# Patient Record
Sex: Female | Born: 1955 | Race: White | Hispanic: No | Smoking: Never smoker
Health system: Southern US, Community
[De-identification: ages and names within clinical notes are randomized; demographics above are authoritative.]

## PROBLEM LIST (undated history)

## (undated) DIAGNOSIS — M1990 Unspecified osteoarthritis, unspecified site: Secondary | ICD-10-CM

## (undated) DIAGNOSIS — T8859XA Other complications of anesthesia, initial encounter: Secondary | ICD-10-CM

## (undated) DIAGNOSIS — Q613 Polycystic kidney, unspecified: Secondary | ICD-10-CM

## (undated) DIAGNOSIS — I10 Essential (primary) hypertension: Secondary | ICD-10-CM

## (undated) DIAGNOSIS — D649 Anemia, unspecified: Secondary | ICD-10-CM

## (undated) DIAGNOSIS — T4145XA Adverse effect of unspecified anesthetic, initial encounter: Secondary | ICD-10-CM

## (undated) DIAGNOSIS — M810 Age-related osteoporosis without current pathological fracture: Secondary | ICD-10-CM

## (undated) DIAGNOSIS — N289 Disorder of kidney and ureter, unspecified: Secondary | ICD-10-CM

## (undated) HISTORY — PX: OTHER SURGICAL HISTORY: SHX169

## (undated) HISTORY — PX: ABDOMINAL HYSTERECTOMY: SHX81

## (undated) HISTORY — PX: CHOLECYSTECTOMY: SHX55

## (undated) HISTORY — PX: HERNIA REPAIR: SHX51

## (undated) HISTORY — PX: EYE SURGERY: SHX253

---

## 2005-10-05 ENCOUNTER — Encounter: Admission: RE | Admit: 2005-10-05 | Discharge: 2005-10-05 | Payer: Self-pay | Admitting: Surgery

## 2005-11-04 ENCOUNTER — Encounter: Admission: RE | Admit: 2005-11-04 | Discharge: 2005-11-04 | Payer: Self-pay | Admitting: Surgery

## 2005-12-02 ENCOUNTER — Ambulatory Visit (HOSPITAL_COMMUNITY): Admission: RE | Admit: 2005-12-02 | Discharge: 2005-12-03 | Payer: Self-pay | Admitting: Surgery

## 2005-12-05 ENCOUNTER — Inpatient Hospital Stay (HOSPITAL_COMMUNITY): Admission: EM | Admit: 2005-12-05 | Discharge: 2005-12-10 | Payer: Self-pay | Admitting: Emergency Medicine

## 2006-01-12 ENCOUNTER — Inpatient Hospital Stay (HOSPITAL_COMMUNITY): Admission: EM | Admit: 2006-01-12 | Discharge: 2006-01-16 | Payer: Self-pay | Admitting: Emergency Medicine

## 2006-01-19 ENCOUNTER — Ambulatory Visit: Payer: Self-pay | Admitting: Internal Medicine

## 2006-03-08 ENCOUNTER — Ambulatory Visit: Payer: Self-pay | Admitting: Family Medicine

## 2008-04-25 ENCOUNTER — Ambulatory Visit: Payer: Self-pay | Admitting: Family Medicine

## 2008-05-28 IMAGING — CT CT ABDOMEN W/ CM
1 of 6 series · 10 of 32 positions shown, 16 images · IV contrast (omnipaque)
Comparison: 10/05/2005

ABDOMEN CT WITH CONTRAST

CLINICAL DATA: Status post laparoscopic cholecystectomy 42. Abdominal pain.
TECHNIQUE: Multidetector CT imaging of the abdomen and pelvis was performed
following the standard protocol during bolus administration of intravenous
contrast.

Contrast:  125 cc Omnipaque 300

[Series 3: abd_pel 5.0 b40f st · axial · 0.55mm/px · z∈[-319,+36]mm · 10 of 87 slices shown, 16 images]
[im 8/87  soft-tissue]
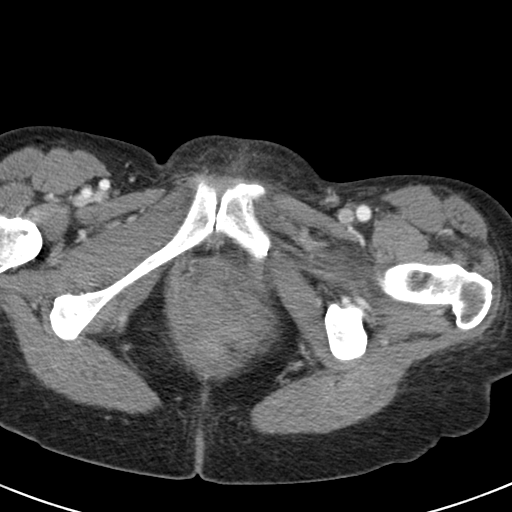
[im 8/87  bone]
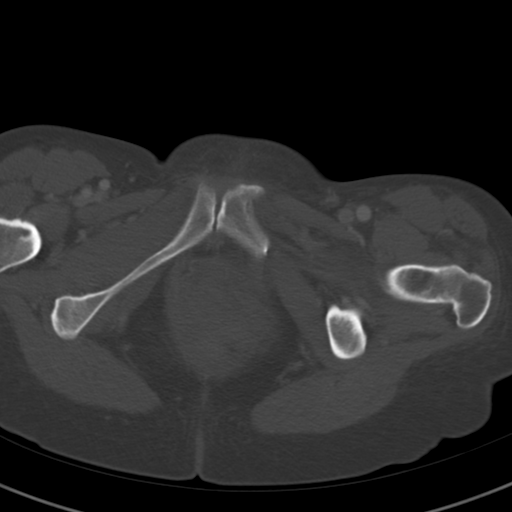
[im 16/87  soft-tissue]
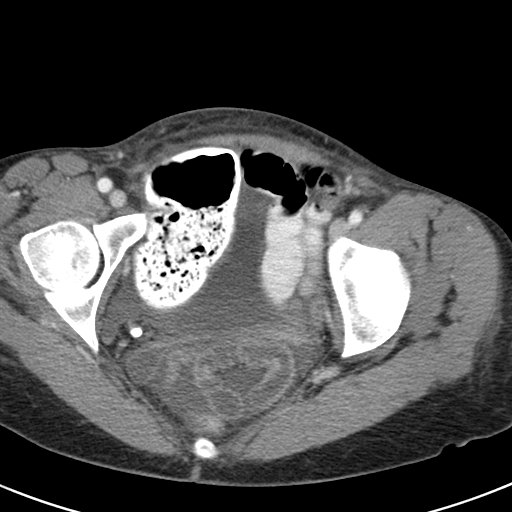
[im 24/87  soft-tissue]
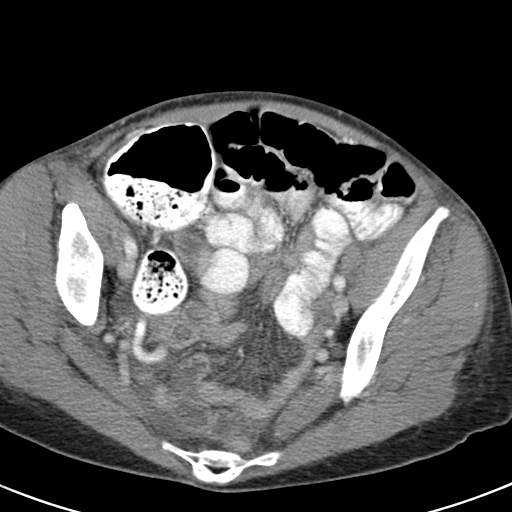
[im 32/87  soft-tissue]
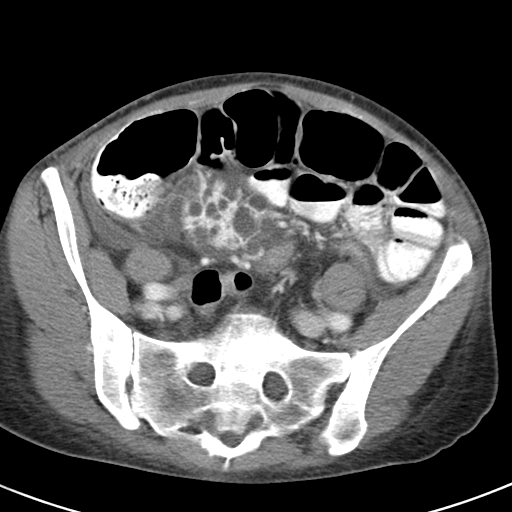
[im 40/87  soft-tissue]
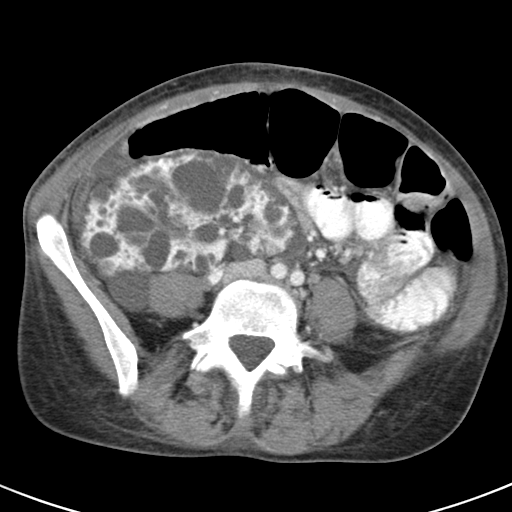
[im 47/87  soft-tissue]
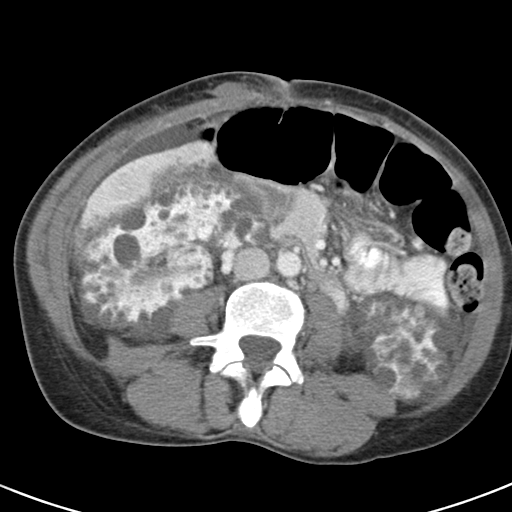
[im 55/87  soft-tissue]
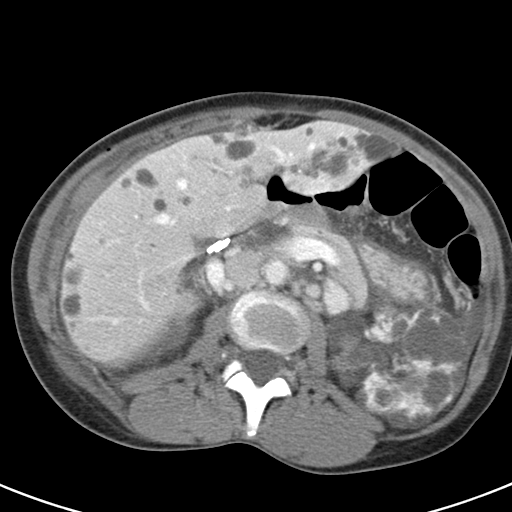
[im 55/87  lung]
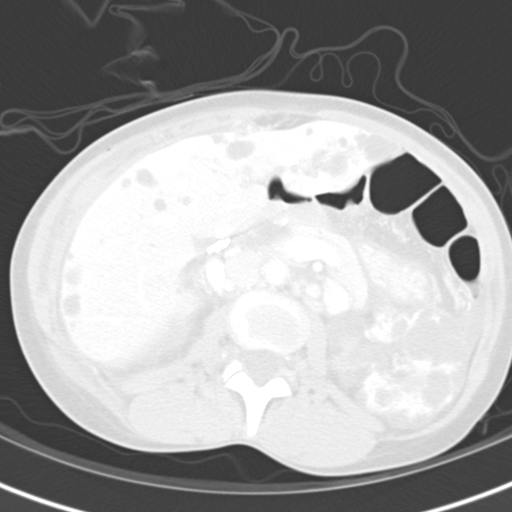
[im 63/87  soft-tissue]
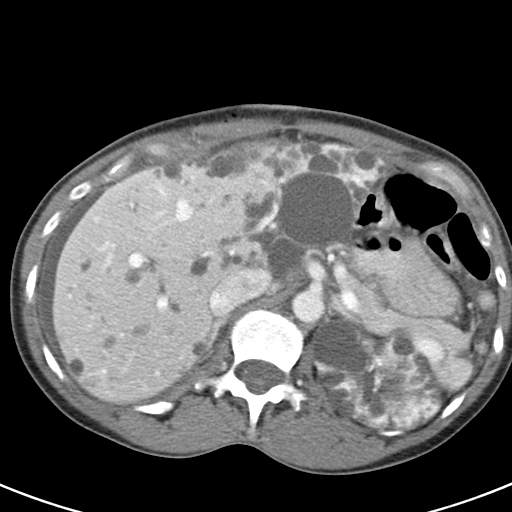
[im 63/87  lung]
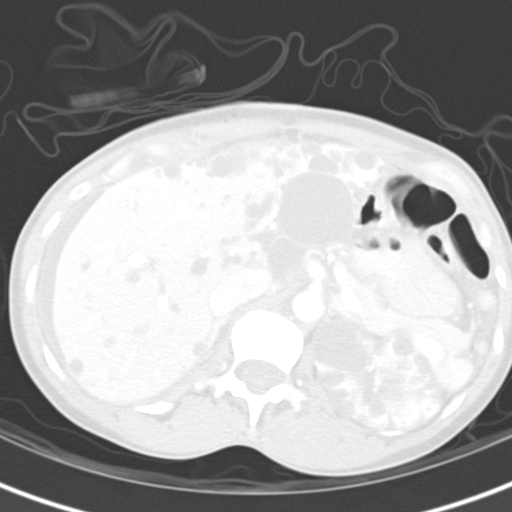
[im 71/87  soft-tissue]
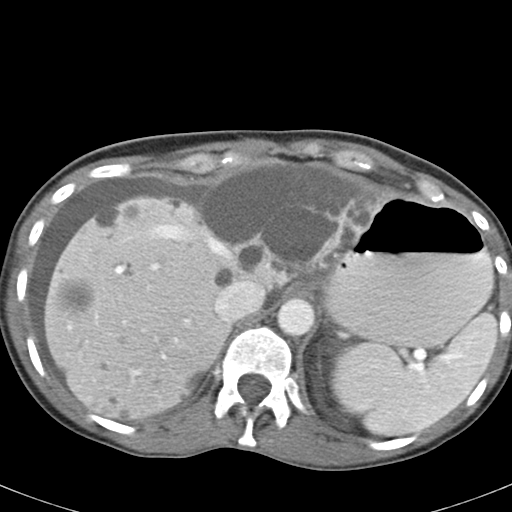
[im 71/87  lung]
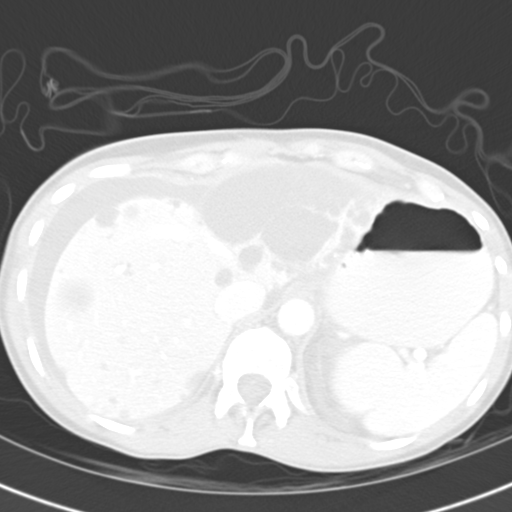
[im 71/87  bone]
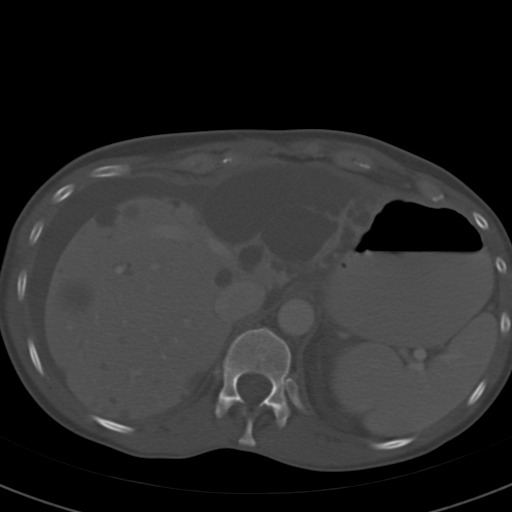
[im 79/87  soft-tissue]
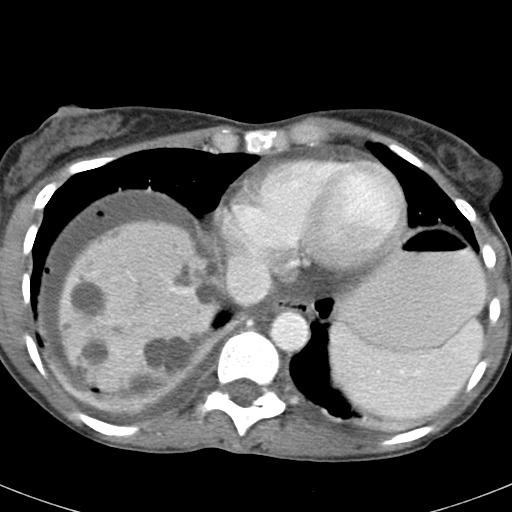
[im 79/87  lung]
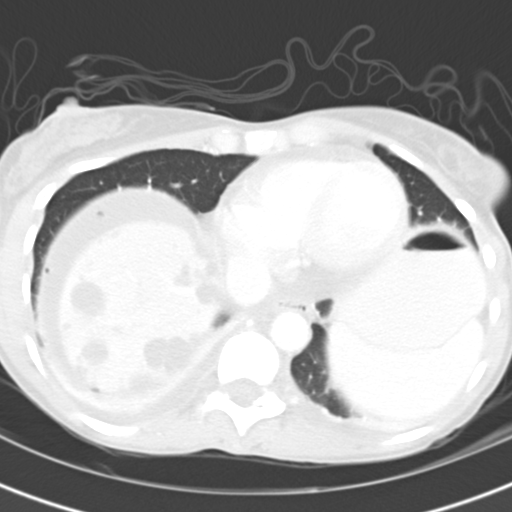

[10 of 32 positions shown; findings below may reference images not displayed]

FINDINGS: Again seen are changes of adult polycystic kidney disease with
extensive bilateral renal and hepatic cysts. Patient is status post
cholecystectomy. There is a moderate amount of ascites noted around the liver
and in the right paracolic gutter. Given recent cholecystectomy, I cannot
exclude bile leak. There are small bilateral pleural effusions and bibasilar
atelectasis. Small amount of gas is noted in the peritoneum, likely related to
recent postoperative state. There is slight prominence of the central
intrahepatic ducts. The common bile duct is mildly dilated to 10 mm. This most
likely represents post cholecystectomy changes. Spleen, adrenals, pancreas
unremarkable.

There is mild gaseous distention of the colon and small bowel loops which may
represent mild ileus.

IMPRESSION

Status post cholecystectomy. There is small to moderate amount of ascites around
the liver and in the right paracolic gutter. I cannot exclude bile leak. Locules
of gas also noted around the liver, likely related to recent surgery.

Mild ileus.

Autosomal dominant polycystic kidney disease.

Small bilateral effusions.

PELVIS CT WITH CONTRAST
FINDINGS: Small to moderate amount of ascites in the pelvis. Bowel remains
mildly dilated. Appendix is normal. Patient is status post hysterectomy. No
adnexal masses.

IMPRESSION

Small to moderate amount of ascites.

Ileus.

## 2008-05-28 IMAGING — NM NM LIVER FUNCTION STUDY
2 series · 7 of 7 positions shown · non-contrast
Comparison: CT 12/06/2005

CLINICAL DATA: Status post cholecystectomy, fever, question bile leak

NM HEPATOBILIARY SCAN
TECHNIQUE: Sequential abdominal images were obtained following intravenous
injection of radiopharmaceutical.
Radiopharmaceutical:  5 mCi Nc-HHm choletec

[Series 1: he hepato · 4.23mm/px · 6 of 45 frames shown]
[frame 4/45]
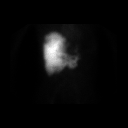
[frame 12/45]
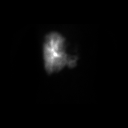
[frame 19/45]
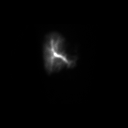
[frame 27/45]
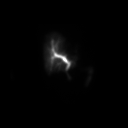
[frame 34/45]
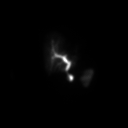
[frame 42/45]
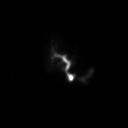

[Series 1: st static · 1 of 1 slices shown]
[im 1/1]
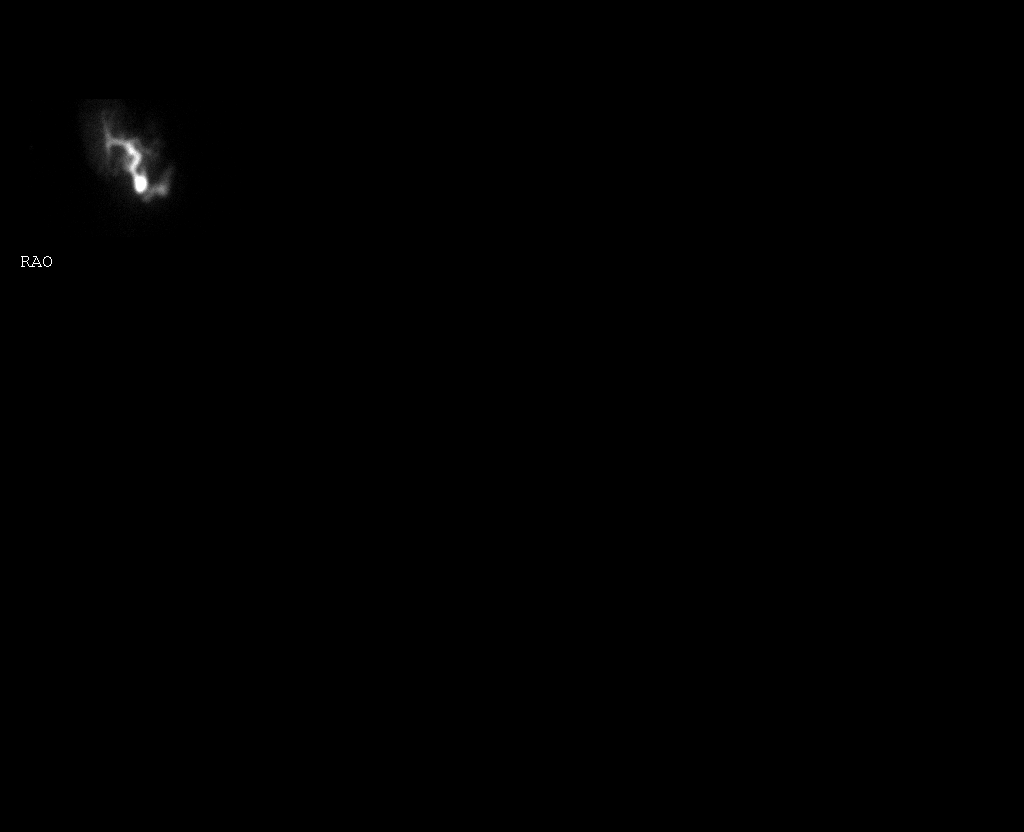

[7 of 7 positions shown; findings below may reference images not displayed]

FINDINGS: There is prompt uptake and excretion of radiotracer by the liver.
Activity is seen within the small bowel at 20 minutes.

Radiotracer is noted passing along the inferior edge of the liver highly
suspicious for a bile leak.

IMPRESSION

Linear radiotracer activity along the inferior surface of the liver highly
suspicious for bile leak.

No evidence of biliary obstruction.

## 2010-06-16 ENCOUNTER — Emergency Department (HOSPITAL_COMMUNITY)
Admission: EM | Admit: 2010-06-16 | Discharge: 2010-06-16 | Disposition: A | Discharge status: Home / Self Care | Payer: BC Managed Care – PPO | Attending: Emergency Medicine | Admitting: Emergency Medicine

## 2010-06-16 ENCOUNTER — Emergency Department (HOSPITAL_COMMUNITY): Payer: BC Managed Care – PPO

## 2010-06-16 DIAGNOSIS — Q613 Polycystic kidney, unspecified: Secondary | ICD-10-CM | POA: Insufficient documentation

## 2010-06-16 DIAGNOSIS — R071 Chest pain on breathing: Secondary | ICD-10-CM | POA: Insufficient documentation

## 2010-06-16 DIAGNOSIS — M549 Dorsalgia, unspecified: Secondary | ICD-10-CM | POA: Insufficient documentation

## 2010-06-16 DIAGNOSIS — Q446 Cystic disease of liver: Secondary | ICD-10-CM | POA: Insufficient documentation

## 2010-06-16 DIAGNOSIS — R079 Chest pain, unspecified: Secondary | ICD-10-CM | POA: Insufficient documentation

## 2010-06-16 LAB — CBC
HCT: 32.8 % — ABNORMAL LOW (ref 36.0–46.0)
Hemoglobin: 10.7 g/dL — ABNORMAL LOW (ref 12.0–15.0)
MCH: 28.3 pg (ref 26.0–34.0)
MCHC: 32.6 g/dL (ref 30.0–36.0)
MCV: 86.8 fL (ref 78.0–100.0)
Platelets: 102 10*3/uL — ABNORMAL LOW (ref 150–400)
RBC: 3.78 MIL/uL — ABNORMAL LOW (ref 3.87–5.11)
RDW: 12.5 % (ref 11.5–15.5)
WBC: 5.3 10*3/uL (ref 4.0–10.5)

## 2010-06-16 LAB — POTASSIUM: Potassium: 4.1 mEq/L (ref 3.5–5.1)

## 2010-06-16 LAB — DIFFERENTIAL
Basophils Absolute: 0 10*3/uL (ref 0.0–0.1)
Basophils Relative: 0 % (ref 0–1)
Eosinophils Absolute: 0.2 10*3/uL (ref 0.0–0.7)
Eosinophils Relative: 3 % (ref 0–5)
Lymphocytes Relative: 19 % (ref 12–46)
Lymphs Abs: 1 10*3/uL (ref 0.7–4.0)
Monocytes Absolute: 0.5 10*3/uL (ref 0.1–1.0)
Monocytes Relative: 9 % (ref 3–12)
Neutro Abs: 3.6 10*3/uL (ref 1.7–7.7)
Neutrophils Relative %: 68 % (ref 43–77)

## 2010-06-16 LAB — URINALYSIS, ROUTINE W REFLEX MICROSCOPIC
Bilirubin Urine: NEGATIVE
Glucose, UA: NEGATIVE mg/dL
Hgb urine dipstick: NEGATIVE
Ketones, ur: NEGATIVE mg/dL
Nitrite: NEGATIVE
Protein, ur: NEGATIVE mg/dL
Specific Gravity, Urine: 1.01 (ref 1.005–1.030)
Urobilinogen, UA: 0.2 mg/dL (ref 0.0–1.0)
pH: 6 (ref 5.0–8.0)

## 2010-06-16 LAB — BASIC METABOLIC PANEL
BUN: 27 mg/dL — ABNORMAL HIGH (ref 6–23)
CO2: 28 mEq/L (ref 19–32)
Calcium: 9.4 mg/dL (ref 8.4–10.5)
Chloride: 106 mEq/L (ref 96–112)
Creatinine, Ser: 1.13 mg/dL (ref 0.4–1.2)
GFR calc Af Amer: >60 mL/min (ref >60)
GFR calc non Af Amer: 50 mL/min — ABNORMAL LOW (ref >60)
Glucose, Bld: 108 mg/dL — ABNORMAL HIGH (ref 70–99)
Potassium: 6.2 mEq/L — ABNORMAL HIGH (ref 3.5–5.1)
Sodium: 139 mEq/L (ref 135–145)

## 2010-06-16 MED ORDER — IOHEXOL 300 MG/ML  SOLN
100.0000 mL | Freq: Once | INTRAMUSCULAR | Status: AC | PRN
  Administered 2010-06-16: 100 mL via INTRAVENOUS

## 2010-06-17 LAB — POCT CARDIAC MARKERS
CKMB, poc: 1.3 ng/mL (ref 1.0–8.0)
Myoglobin, poc: 187 ng/mL (ref 12–200)
Troponin i, poc: <0.05 ng/mL (ref 0.00–0.09)

## 2010-06-17 LAB — URINE CULTURE
Colony Count: NO GROWTH
Culture  Setup Time: 201203140111
Culture: NO GROWTH

## 2012-04-11 ENCOUNTER — Ambulatory Visit: Payer: Self-pay | Admitting: Family Medicine

## 2012-11-09 ENCOUNTER — Ambulatory Visit: Payer: Self-pay | Admitting: Unknown Physician Specialty

## 2014-10-03 ENCOUNTER — Inpatient Hospital Stay: Admission: RE | Admit: 2014-10-03 | Payer: Self-pay | Source: Ambulatory Visit

## 2014-10-11 DIAGNOSIS — Q613 Polycystic kidney, unspecified: Secondary | ICD-10-CM | POA: Diagnosis not present

## 2014-10-11 DIAGNOSIS — H2511 Age-related nuclear cataract, right eye: Secondary | ICD-10-CM | POA: Diagnosis not present

## 2014-10-11 DIAGNOSIS — M810 Age-related osteoporosis without current pathological fracture: Secondary | ICD-10-CM | POA: Diagnosis not present

## 2014-10-13 NOTE — H&P (Signed)
  History and physical was faxed and scanned in.   

## 2014-10-14 ENCOUNTER — Encounter: Payer: Self-pay | Admitting: *Deleted

## 2014-10-14 ENCOUNTER — Encounter
Admission: RE | Disposition: A | Discharge status: Home / Self Care | Payer: Self-pay | Source: Ambulatory Visit | Attending: Ophthalmology

## 2014-10-14 ENCOUNTER — Ambulatory Visit: Payer: BLUE CROSS/BLUE SHIELD | Admitting: *Deleted

## 2014-10-14 ENCOUNTER — Ambulatory Visit
Admission: RE | Admit: 2014-10-14 | Discharge: 2014-10-14 | Disposition: A | Discharge status: Home / Self Care | Payer: BLUE CROSS/BLUE SHIELD | Source: Ambulatory Visit | Attending: Ophthalmology | Admitting: Ophthalmology

## 2014-10-14 DIAGNOSIS — M810 Age-related osteoporosis without current pathological fracture: Secondary | ICD-10-CM | POA: Insufficient documentation

## 2014-10-14 DIAGNOSIS — H2511 Age-related nuclear cataract, right eye: Secondary | ICD-10-CM | POA: Insufficient documentation

## 2014-10-14 DIAGNOSIS — Q613 Polycystic kidney, unspecified: Secondary | ICD-10-CM | POA: Insufficient documentation

## 2014-10-14 HISTORY — DX: Polycystic kidney, unspecified: Q61.3

## 2014-10-14 HISTORY — PX: CATARACT EXTRACTION W/PHACO: SHX586

## 2014-10-14 HISTORY — DX: Age-related osteoporosis without current pathological fracture: M81.0

## 2014-10-14 SURGERY — PHACOEMULSIFICATION, CATARACT, WITH IOL INSERTION
Anesthesia: Monitor Anesthesia Care | Site: Eye | Laterality: Left | Wound class: Clean

## 2014-10-14 MED ORDER — CEFUROXIME OPHTHALMIC INJECTION 1 MG/0.1 ML
INJECTION | OPHTHALMIC | Status: DC | PRN
  Administered 2014-10-14: .5 mL via INTRACAMERAL

## 2014-10-14 MED ORDER — CARBACHOL 0.01 % IO SOLN
INTRAOCULAR | Status: DC | PRN
  Administered 2014-10-14: 0.5 mL via INTRAOCULAR

## 2014-10-14 MED ORDER — MOXIFLOXACIN HCL 0.5 % OP SOLN
OPHTHALMIC | Status: AC
  Administered 2014-10-14: 1 [drp] via OPHTHALMIC
  Filled 2014-10-14: qty 3

## 2014-10-14 MED ORDER — HYALURONIDASE HUMAN 150 UNIT/ML IJ SOLN
INTRAMUSCULAR | Status: AC
  Filled 2014-10-14: qty 1

## 2014-10-14 MED ORDER — ALFENTANIL 500 MCG/ML IJ INJ
INJECTION | INTRAMUSCULAR | Status: DC | PRN
  Administered 2014-10-14: 500 ug via INTRAVENOUS

## 2014-10-14 MED ORDER — PHENYLEPHRINE HCL 10 % OP SOLN
1.0000 [drp] | OPHTHALMIC | Status: DC | PRN
  Administered 2014-10-14: 1 [drp] via OPHTHALMIC

## 2014-10-14 MED ORDER — ACETAMINOPHEN 325 MG PO TABS
ORAL_TABLET | ORAL | Status: AC
  Filled 2014-10-14: qty 2

## 2014-10-14 MED ORDER — MIDAZOLAM HCL 2 MG/2ML IJ SOLN
INTRAMUSCULAR | Status: DC | PRN
  Administered 2014-10-14: 1 mg via INTRAVENOUS

## 2014-10-14 MED ORDER — PHENYLEPHRINE HCL 10 % OP SOLN
OPHTHALMIC | Status: AC
  Administered 2014-10-14: 1 [drp] via OPHTHALMIC
  Filled 2014-10-14: qty 5

## 2014-10-14 MED ORDER — MOXIFLOXACIN HCL 0.5 % OP SOLN - NO CHARGE
OPHTHALMIC | Status: DC | PRN
  Administered 2014-10-14: 1 [drp] via OPHTHALMIC

## 2014-10-14 MED ORDER — CYCLOPENTOLATE HCL 2 % OP SOLN
1.0000 [drp] | OPHTHALMIC | Status: DC | PRN
  Administered 2014-10-14: 1 [drp] via OPHTHALMIC

## 2014-10-14 MED ORDER — ACETAMINOPHEN 325 MG PO TABS
650.0000 mg | ORAL_TABLET | Freq: Four times a day (QID) | ORAL | Status: DC | PRN
  Administered 2014-10-14: 650 mg via ORAL

## 2014-10-14 MED ORDER — EPINEPHRINE HCL 1 MG/ML IJ SOLN
INTRAMUSCULAR | Status: AC
  Filled 2014-10-14: qty 2

## 2014-10-14 MED ORDER — EPINEPHRINE HCL 1 MG/ML IJ SOLN
INTRAOCULAR | Status: DC | PRN
  Administered 2014-10-14: 200 mL

## 2014-10-14 MED ORDER — BUPIVACAINE HCL (PF) 0.75 % IJ SOLN
INTRAMUSCULAR | Status: AC
  Filled 2014-10-14: qty 10

## 2014-10-14 MED ORDER — NA CHONDROIT SULF-NA HYALURON 40-17 MG/ML IO SOLN
INTRAOCULAR | Status: AC
  Filled 2014-10-14: qty 1

## 2014-10-14 MED ORDER — TETRACAINE HCL 0.5 % OP SOLN
OPHTHALMIC | Status: DC | PRN
  Administered 2014-10-14: 1 [drp] via OPHTHALMIC

## 2014-10-14 MED ORDER — TETRACAINE HCL 0.5 % OP SOLN
OPHTHALMIC | Status: AC
  Filled 2014-10-14: qty 2

## 2014-10-14 MED ORDER — LIDOCAINE HCL (PF) 4 % IJ SOLN
INTRAMUSCULAR | Status: AC
  Filled 2014-10-14: qty 10

## 2014-10-14 MED ORDER — SODIUM CHLORIDE 0.9 % IV SOLN
INTRAVENOUS | Status: DC
  Administered 2014-10-14: 11:00:00 via INTRAVENOUS

## 2014-10-14 MED ORDER — MOXIFLOXACIN HCL 0.5 % OP SOLN
1.0000 [drp] | OPHTHALMIC | Status: DC | PRN
  Administered 2014-10-14: 1 [drp] via OPHTHALMIC

## 2014-10-14 MED ORDER — NA CHONDROIT SULF-NA HYALURON 40-17 MG/ML IO SOLN
INTRAOCULAR | Status: DC | PRN
  Administered 2014-10-14: 1 mL via INTRAOCULAR

## 2014-10-14 MED ORDER — CYCLOPENTOLATE HCL 2 % OP SOLN
OPHTHALMIC | Status: AC
  Administered 2014-10-14: 1 [drp] via OPHTHALMIC
  Filled 2014-10-14: qty 2

## 2014-10-14 MED ORDER — CEFUROXIME OPHTHALMIC INJECTION 1 MG/0.1 ML
INJECTION | OPHTHALMIC | Status: AC
  Filled 2014-10-14: qty 0.1

## 2014-10-14 SURGICAL SUPPLY — 25 items
CORD BIP STRL DISP 12FT (MISCELLANEOUS) ×2 IMPLANT
DRAPE XRAY CASSETTE 23X24 (DRAPES) ×2 IMPLANT
ERASER HMR WETFIELD 18G (MISCELLANEOUS) ×2 IMPLANT
GLOVE BIO SURGEON STRL SZ8 (GLOVE) ×2 IMPLANT
GLOVE SURG LX 6.5 MICRO (GLOVE) ×1
GLOVE SURG LX 8.0 MICRO (GLOVE) ×1
GLOVE SURG LX STRL 6.5 MICRO (GLOVE) ×1 IMPLANT
GLOVE SURG LX STRL 8.0 MICRO (GLOVE) ×1 IMPLANT
GOWN STRL REUS W/ TWL LRG LVL3 (GOWN DISPOSABLE) ×1 IMPLANT
GOWN STRL REUS W/ TWL XL LVL3 (GOWN DISPOSABLE) ×1 IMPLANT
GOWN STRL REUS W/TWL LRG LVL3 (GOWN DISPOSABLE) ×2
GOWN STRL REUS W/TWL XL LVL3 (GOWN DISPOSABLE) ×2
LENS IOL ACRSF IQ ULTRA 14.0 (Intraocular Lens) IMPLANT
LENS IOL ACRYSOF IQ 14.0 (Intraocular Lens) ×2 IMPLANT
PACK CATARACT (MISCELLANEOUS) ×2 IMPLANT
PACK CATARACT DINGLEDEIN LX (MISCELLANEOUS) ×2 IMPLANT
PACK EYE AFTER SURG (MISCELLANEOUS) ×2 IMPLANT
SHLD EYE VISITEC  UNIV (MISCELLANEOUS) ×2 IMPLANT
SOL PREP PVP 2OZ (MISCELLANEOUS) ×2
SOLUTION PREP PVP 2OZ (MISCELLANEOUS) ×1 IMPLANT
SUT SILK 5-0 (SUTURE) ×2 IMPLANT
SYR 5ML LL (SYRINGE) ×2 IMPLANT
SYR TB 1ML 27GX1/2 LL (SYRINGE) ×2 IMPLANT
WATER STERILE IRR 1000ML POUR (IV SOLUTION) ×2 IMPLANT
WIPE NON LINTING 3.25X3.25 (MISCELLANEOUS) ×2 IMPLANT

## 2014-10-14 NOTE — Op Note (Signed)
Date of Surgery: 10/14/2014 Date of Dictation: 10/14/2014 12:28 PM Pre-operative Diagnosis:  Nuclear Sclerotic Cataract right Eye Post-operative Diagnosis: same Procedure performed: Extra-capsular Cataract Extraction (ECCE) with placement of a posterior chamber intraocular lens (IOL) right Eye IOL:  Implant Name Type Inv. Item Serial No. Manufacturer Lot No. LRB No. Used  [id number]     [id number]     Left 1   Anesthesia: 2% Lidocaine and 4% Marcaine in a 50/50 mixture with 10 unites/ml of Hylenex given as a peribulbar Anesthesiologist: Anesthesiologist: Linward NatalAmy Rice, MD CRNA: Darrol Jumpavid Marion, CRNA Complications: none Estimated Blood Loss: less than 1 ml  Description of procedure:  The patient was given anesthesia and sedation via intravenous access. The patient was then prepped and draped in the usual fashion. A 25-gauge needle was bent for initiating the capsulorhexis. A 5-0 silk suture was placed through the conjunctiva superior and inferiorly to serve as bridle sutures. Hemostasis was obtained at the superior limbus using an eraser cautery. A partial thickness groove was made at the anterior surgical limbus with a 64 Beaver blade and this was dissected anteriorly with an SYSCOlcon Crescent knife. The anterior chamber was entered at 10 o'clock with a 1.0 mm paracentesis knife and through the lamellar dissection with a 2.6 mm Alcon keratome. DiscoVisc was injected to replace the aqueous and a continuous tear curvilinear capsulorhexis was performed using a bent 25-gauge needle.  Balance salt on a syringe was used to perform hydro-dissection and phacoemulsification was carried out using a divide and conquer technique. Procedure(s) with comments: CATARACT EXTRACTION PHACO AND INTRAOCULAR LENS PLACEMENT (IOC) (Left) - US 01:09 AP% 60.0 CDE 37.21 fluid pack lot #[id number]. Irrigation/aspiration was used to remove the residual cortex and the capsular bag was inflated with DiscoVisc. The intraocular lens was  inserted into the capsular bag using a pre-loaded UltraSert Delivery System. Irrigation/aspiration was used to remove the residual DiscoVisc. The wound was inflated with balanced salt and checked for leaks. None were found. Miostat was injected via the paracentesis track and 0.1 ml of cefuroxime containing 1 mg of drug  was injected via the paracentesis track. The wound was checked for leaks again and none were found.   The bridal sutures were removed and two drops of Vigamox were placed on the eye. An eye shield was placed to protect the eye and the patient was discharged to the recovery area in good condition.   Vanellope Passmore MD

## 2014-10-14 NOTE — Anesthesia Postprocedure Evaluation (Signed)
  Anesthesia Post-op Note  Patient: Jamie Foley  Procedure(s) Performed: Procedure(s) with comments: CATARACT EXTRACTION PHACO AND INTRAOCULAR LENS PLACEMENT (IOC) (Left) - US 01:09 AP% 60.0 CDE 37.21 fluid pack lot #[id number] H  Anesthesia type:MAC  Patient location: PACU  Post pain: Pain level controlled  Post assessment: Post-op Vital signs reviewed, Patient's Cardiovascular Status Stable, Respiratory Function Stable, Patent Airway and No signs of Nausea or vomiting  Post vital signs: Reviewed and stable  Last Vitals:  Filed Vitals:   10/14/14 1231  BP: 148/82  Pulse:   Temp: 36.6 C  Resp: 16    Level of consciousness: awake, alert  and patient cooperative  Complications: No apparent anesthesia complications

## 2014-10-14 NOTE — Discharge Instructions (Addendum)
See handout Eye Surgery Discharge Instructions  Expect mild scratchy sensation or mild soreness. DO NOT RUB YOUR EYE!  The day of surgery:  Minimal physical activity, but bed rest is not required  No reading, computer work, or close hand work  No bending, lifting, or straining.  May watch TV  For 24 hours:  No driving, legal decisions, or alcoholic beverages  Safety precautions  Eat anything you prefer: It is better to start with liquids, then soup then solid foods.  _____ Eye patch should be worn until postoperative exam tomorrow.  ____ Solar shield eyeglasses should be worn for comfort in the sunlight/patch while sleeping  Resume all regular medications including aspirin or Coumadin if these were discontinued prior to surgery. You may shower, bathe, shave, or wash your hair. Tylenol may be taken for mild discomfort.  Call your doctor if you experience significant pain, nausea, or vomiting, fever > 101 or other signs of infection. [phone] or [phone] Specific instructions:  Follow-up Information    Follow up with Sallee LangeINGELDEIN,STEVEN, MD On 10/15/2014.   Specialty:  Ophthalmology   Why:  9:10   Contact information:   [address]   [place] [id number] [phone]

## 2014-10-14 NOTE — Anesthesia Preprocedure Evaluation (Signed)
Anesthesia Evaluation  Patient identified by MRN, date of birth, ID band Patient awake    Reviewed: Allergy & Precautions, NPO status , Patient's Chart, lab work & pertinent test results  Airway Mallampati: I  TM Distance: >3 FB Neck ROM: Full    Dental  (+) Teeth Intact   Pulmonary    Pulmonary exam normal       Cardiovascular Exercise Tolerance: Good negative cardio ROS Normal cardiovascular exam    Neuro/Psych    GI/Hepatic   Endo/Other    Renal/GU      Musculoskeletal   Abdominal Normal abdominal exam  (+)   Peds  Hematology   Anesthesia Other Findings   Reproductive/Obstetrics                             Anesthesia Physical Anesthesia Plan  ASA: II  Anesthesia Plan: MAC   Post-op Pain Management:    Induction:   Airway Management Planned: Nasal Cannula  Additional Equipment:   Intra-op Plan:   Post-operative Plan:   Informed Consent: I have reviewed the patients History and Physical, chart, labs and discussed the procedure including the risks, benefits and alternatives for the proposed anesthesia with the patient or authorized representative who has indicated his/her understanding and acceptance.     Plan Discussed with: CRNA  Anesthesia Plan Comments:         Anesthesia Quick Evaluation

## 2014-10-14 NOTE — Anesthesia Postprocedure Evaluation (Signed)
  Anesthesia Post-op Note  Patient: Jamie Foley  Procedure(s) Performed: Procedure(s) with comments: CATARACT EXTRACTION PHACO AND INTRAOCULAR LENS PLACEMENT (IOC) (Left) - US 01:09 AP% 60.0 CDE 37.21 fluid pack lot #[id number]  Anesthesia type:MAC  Patient location: PACU  Post pain: Pain level controlled  Post assessment: Post-op Vital signs reviewed, Patient's Cardiovascular Status Stable, Respiratory Function Stable, Patent Airway and No signs of Nausea or vomiting  Post vital signs: Reviewed and stable  Last Vitals:  Filed Vitals:   10/14/14 1231  BP: 148/82  Pulse:   Temp: 36.6 C  Resp: 16    Level of consciousness: awake, alert  and patient cooperative  Complications: No apparent anesthesia complications  

## 2014-10-14 NOTE — Interval H&P Note (Signed)
History and Physical Interval Note:  10/14/2014 11:46 AM  Jamie Foley  has presented today for surgery, with the diagnosis of CATARACT  The various methods of treatment have been discussed with the patient and family. After consideration of risks, benefits and other options for treatment, the patient has consented to  Procedure(s): CATARACT EXTRACTION PHACO AND INTRAOCULAR LENS PLACEMENT (IOC) (Left) as a surgical intervention .  The patient's history has been reviewed, patient examined, no change in status, stable for surgery.  I have reviewed the patient's chart and labs.  Questions were answered to the patient's satisfaction.     Aven Christen

## 2014-10-14 NOTE — Transfer of Care (Signed)
Immediate Anesthesia Transfer of Care Note  Patient: Jamie RamusRebecca D Mazon  Procedure(s) Performed: Procedure(s) with comments: CATARACT EXTRACTION PHACO AND INTRAOCULAR LENS PLACEMENT (IOC) (Left) - US 01:09 AP% 60.0 CDE 37.21 fluid pack lot #[id number] H  Patient Location: PACU  Anesthesia Type:MAC  Level of Consciousness: awake, alert , oriented and patient cooperative  Airway & Oxygen Therapy: Patient Spontanous Breathing  Post-op Assessment: Report given to RN and Post -op Vital signs reviewed and stable  Post vital signs: Reviewed and stable  Last Vitals:  Filed Vitals:   10/14/14 1231  BP: 148/82  Pulse:   Temp: 36.6 C  Resp: 16    Complications: No apparent anesthesia complications

## 2015-05-28 ENCOUNTER — Encounter: Payer: Self-pay | Admitting: Ophthalmology

## 2015-10-05 ENCOUNTER — Emergency Department
Admission: EM | Admit: 2015-10-05 | Discharge: 2015-10-05 | Disposition: A | Discharge status: Home / Self Care | Payer: BLUE CROSS/BLUE SHIELD | Attending: Emergency Medicine | Admitting: Emergency Medicine

## 2015-10-05 ENCOUNTER — Emergency Department: Payer: BLUE CROSS/BLUE SHIELD

## 2015-10-05 DIAGNOSIS — M810 Age-related osteoporosis without current pathological fracture: Secondary | ICD-10-CM | POA: Insufficient documentation

## 2015-10-05 DIAGNOSIS — Y999 Unspecified external cause status: Secondary | ICD-10-CM | POA: Diagnosis not present

## 2015-10-05 DIAGNOSIS — S61211A Laceration without foreign body of left index finger without damage to nail, initial encounter: Secondary | ICD-10-CM | POA: Diagnosis not present

## 2015-10-05 DIAGNOSIS — S6992XA Unspecified injury of left wrist, hand and finger(s), initial encounter: Secondary | ICD-10-CM | POA: Diagnosis present

## 2015-10-05 DIAGNOSIS — Y939 Activity, unspecified: Secondary | ICD-10-CM | POA: Diagnosis not present

## 2015-10-05 DIAGNOSIS — W231XXA Caught, crushed, jammed, or pinched between stationary objects, initial encounter: Secondary | ICD-10-CM | POA: Insufficient documentation

## 2015-10-05 DIAGNOSIS — Y92812 Truck as the place of occurrence of the external cause: Secondary | ICD-10-CM | POA: Insufficient documentation

## 2015-10-05 DIAGNOSIS — S67191A Crushing injury of left index finger, initial encounter: Secondary | ICD-10-CM

## 2015-10-05 MED ORDER — TETANUS-DIPHTH-ACELL PERTUSSIS 5-2.5-18.5 LF-MCG/0.5 IM SUSP
0.5000 mL | Freq: Once | INTRAMUSCULAR | Status: AC
  Administered 2015-10-05: 0.5 mL via INTRAMUSCULAR
  Filled 2015-10-05: qty 0.5

## 2015-10-05 MED ORDER — BACITRACIN ZINC 500 UNIT/GM EX OINT
TOPICAL_OINTMENT | CUTANEOUS | Status: AC
  Administered 2015-10-05: 1 via TOPICAL
  Filled 2015-10-05: qty 0.9

## 2015-10-05 MED ORDER — BACITRACIN ZINC 500 UNIT/GM EX OINT
1.0000 "application " | TOPICAL_OINTMENT | Freq: Two times a day (BID) | CUTANEOUS | Status: DC
  Administered 2015-10-05: 1 via TOPICAL
  Filled 2015-10-05: qty 0.9

## 2015-10-05 NOTE — Discharge Instructions (Signed)
Crush Injury, Fingers or Toes A crush injury to the fingers or toes means the tissues have been damaged by being squeezed (compressed). There will be bleeding into the tissues and swelling. Often, blood will collect under the skin. When this happens, the skin on the finger often dies and may slough off (shed) 1 week to 10 days later. Usually, new skin is growing underneath. If the injury has been too severe and the tissue does not survive, the damaged tissue may begin to turn black over several days.  Wounds which occur because of the crushing may be stitched (sutured) shut. However, crush injuries are more likely to become infected than other injuries.These wounds may not be closed as tightly as other types of cuts to prevent infection. Nails involved are often lost. These usually grow back over several weeks.  DIAGNOSIS X-rays may be taken to see if there is any injury to the bones. TREATMENT Broken bones (fractures) may be treated with splinting, depending on the fracture. Often, no treatment is required for fractures of the last bone in the fingers or toes. HOME CARE INSTRUCTIONS   The crushed part should be raised (elevated) above the heart or center of the chest as much as possible for the first several days or as directed. This helps with pain and lessens swelling. Less swelling increases the chances that the crushed part will survive.  Put ice on the injured area.  Put ice in a plastic bag.  Place a towel between your skin and the bag.  Leave the ice on for 15-20 minutes, 03-04 times a day for the first 2 days.  Only take over-the-counter or prescription medicines for pain, discomfort, or fever as directed by your caregiver.  Use your injured part only as directed.  Change your bandages (dressings) as directed.  Keep all follow-up appointments as directed by your caregiver. Not keeping your appointment could result in a chronic or permanent injury, pain, and disability. If there is  any problem keeping the appointment, you must call to reschedule. SEEK IMMEDIATE MEDICAL CARE IF:   There is redness, swelling, or increasing pain in the wound area.  Pus is coming from the wound.  You have a fever.  You notice a bad smell coming from the wound or dressing.  The edges of the wound do not stay together after the sutures have been removed.  You are unable to move the injured finger or toe. MAKE SURE YOU:   Understand these instructions.  Will watch your condition.  Will get help right away if you are not doing well or get worse.   This information is not intended to replace advice given to you by your health care provider. Make sure you discuss any questions you have with your health care provider.   Document Released: 03/22/2005 Document Revised: 06/14/2011 Document Reviewed: 08/07/2010 Elsevier Interactive Patient Education 2016 [employer]  Laceration Care, Adult A laceration is a cut that goes through all of the layers of the skin and into the tissue that is right under the skin. Some lacerations heal on their own. Others need to be closed with stitches (sutures), staples, skin adhesive strips, or skin glue. Proper laceration care minimizes the risk of infection and helps the laceration to heal better. HOW TO CARE FOR YOUR LACERATION If sutures or staples were used:  Keep the wound clean and dry.  If you were given a bandage (dressing), you should change it at least one time per day or as told  by your health care provider. You should also change it if it becomes wet or dirty.  Keep the wound completely dry for the first 24 hours or as told by your health care provider. After that time, you may shower or bathe. However, make sure that the wound is not soaked in water until after the sutures or staples have been removed.  Clean the wound one time each day or as told by your health care provider:  Wash the wound with soap and water.  Rinse the wound with  water to remove all soap.  Pat the wound dry with a clean towel. Do not rub the wound.  After cleaning the wound, apply a thin layer of antibiotic ointmentas told by your health care provider. This will help to prevent infection and keep the dressing from sticking to the wound.  Have the sutures or staples removed as told by your health care provider. If skin adhesive strips were used:  Keep the wound clean and dry.  If you were given a bandage (dressing), you should change it at least one time per day or as told by your health care provider. You should also change it if it becomes dirty or wet.  Do not get the skin adhesive strips wet. You may shower or bathe, but be careful to keep the wound dry.  If the wound gets wet, pat it dry with a clean towel. Do not rub the wound.  Skin adhesive strips fall off on their own. You may trim the strips as the wound heals. Do not remove skin adhesive strips that are still stuck to the wound. They will fall off in time. If skin glue was used:  Try to keep the wound dry, but you may briefly wet it in the shower or bath. Do not soak the wound in water, such as by swimming.  After you have showered or bathed, gently pat the wound dry with a clean towel. Do not rub the wound.  Do not do any activities that will make you sweat heavily until the skin glue has fallen off on its own.  Do not apply liquid, cream, or ointment medicine to the wound while the skin glue is in place. Using those may loosen the film before the wound has healed.  If you were given a bandage (dressing), you should change it at least one time per day or as told by your health care provider. You should also change it if it becomes dirty or wet.  If a dressing is placed over the wound, be careful not to apply tape directly over the skin glue. Doing that may cause the glue to be pulled off before the wound has healed.  Do not pick at the glue. The skin glue usually remains in place  for 5-10 days, then it falls off of the skin. General Instructions  Take over-the-counter and prescription medicines only as told by your health care provider.  If you were prescribed an antibiotic medicine or ointment, take or apply it as told by your doctor. Do not stop using it even if your condition improves.  To help prevent scarring, make sure to cover your wound with sunscreen whenever you are outside after stitches are removed, after adhesive strips are removed, or when glue remains in place and the wound is healed. Make sure to wear a sunscreen of at least 30 SPF.  Do not scratch or pick at the wound.  Keep all follow-up visits as told by  your health care provider. This is important.  Check your wound every day for signs of infection. Watch for:  Redness, swelling, or pain.  Fluid, blood, or pus.  Raise (elevate) the injured area above the level of your heart while you are sitting or lying down, if possible. SEEK MEDICAL CARE IF:  You received a tetanus shot and you have swelling, severe pain, redness, or bleeding at the injection site.  You have a fever.  A wound that was closed breaks open.  You notice a bad smell coming from your wound or your dressing.  You notice something coming out of the wound, such as wood or glass.  Your pain is not controlled with medicine.  You have increased redness, swelling, or pain at the site of your wound.  You have fluid, blood, or pus coming from your wound.  You notice a change in the color of your skin near your wound.  You need to change the dressing frequently due to fluid, blood, or pus draining from the wound.  You develop a new rash.  You develop numbness around the wound. SEEK IMMEDIATE MEDICAL CARE IF:  You develop severe swelling around the wound.  Your pain suddenly increases and is severe.  You develop painful lumps near the wound or on skin that is anywhere on your body.  You have a red streak going away  from your wound.  The wound is on your hand or foot and you cannot properly move a finger or toe.  The wound is on your hand or foot and you notice that your fingers or toes look pale or bluish.   This information is not intended to replace advice given to you by your health care provider. Make sure you discuss any questions you have with your health care provider.   Document Released: 03/22/2005 Document Revised: 08/06/2014 Document Reviewed: 03/18/2014 Elsevier Interactive Patient Education [employer]2016 [employer]

## 2015-10-05 NOTE — ED Provider Notes (Signed)
[facility] Emergency Department Provider Note  ____________________________________________  Time seen: Approximately 9:31 PM  I have reviewed the triage vital signs and the nursing notes.   HISTORY  Chief Complaint Laceration   HPI Jamie Foley is a 60 y.o. female who presents to the emergency department for evaluation of an injury to the left index finger. She states that she slammed her finger in a truck door today about 4:30 this afternoon and it has continuously bled since that time. She has attempted to apply pressure and ice to the wound with out success. Tetanus is not up-to-date.  Past Medical History  Diagnosis Date  . Polycystic kidney disease   . Osteoporosis     There are no active problems to display for this patient.   Past Surgical History  Procedure Laterality Date  . Cholecystectomy    . Abdominal hysterectomy    . Right ankle fracture Right   . Hernia repair    . Cataract extraction w/phaco Left 10/14/2014    Procedure: CATARACT EXTRACTION PHACO AND INTRAOCULAR LENS PLACEMENT (IOC);  Surgeon: Sallee LangeSteven Dingeldein, MD;  Location: [facility] ORS;  Service: Ophthalmology;  Laterality: Left;  US 01:09 AP% 60.0 CDE 37.21 fluid pack lot #[id number]    Current Outpatient Rx  Name  Route  Sig  Dispense  Refill  . alendronate (FOSAMAX) 70 MG tablet   Oral   Take 70 mg by mouth once a week. Take with a full glass of water on an empty stomach.         Marland Kitchen. levofloxacin (LEVAQUIN) 500 MG tablet   Oral   Take 500 mg by mouth daily.         . promethazine (PHENERGAN) 25 MG tablet   Oral   Take 25 mg by mouth every 6 (six) hours as needed for nausea or vomiting.           Allergies Augmentin  No family history on file.  Social History Social History  Substance Use Topics  . Smoking status: Not on file  . Smokeless tobacco: Not on file  . Alcohol Use: Not on file    Review of Systems  Constitutional: Negative for  fever/chills Respiratory: Negative for shortness of breath. Musculoskeletal: Positive for pain. Skin: Positive for wound. Neurological: Negative for headaches, focal weakness or numbness. ____________________________________________   PHYSICAL EXAM:  VITAL SIGNS: ED Triage Vitals  Enc Vitals Group     BP 10/05/15 2016 165/86 mmHg     Pulse Rate 10/05/15 2016 80     Resp 10/05/15 2016 16     Temp 10/05/15 2016 98.1 F (36.7 C)     Temp Source 10/05/15 2016 Oral     SpO2 10/05/15 2016 100 %     Weight 10/05/15 2016 100 lb (45.36 kg)     Height 10/05/15 2016 5\' 5"  (1.651 m)     Head Cir --      Peak Flow --      Pain Score 10/05/15 2016 5     Pain Loc --      Pain Edu? --      Excl. in GC? --      Constitutional: Alert and oriented. Well appearing and in no acute distress. Eyes: Conjunctivae are normal. EOMI. Nose: No congestion/rhinnorhea. Mouth/Throat: Mucous membranes are moist.   Cardiovascular: Good peripheral circulation. Respiratory: Normal respiratory effort.  No retractions. Musculoskeletal: Limited range of motion of the left index finger at the DIP due to pain. Neurologic:  Normal speech and language. No gross focal neurologic deficits are appreciated. Skin:  Skin tear noted just below the cuticle fold of the left index finger.  ____________________________________________   LABS (all labs ordered are listed, but only abnormal results are displayed)  Labs Reviewed - No data to display ____________________________________________  EKG   ____________________________________________  RADIOLOGY  No acute abnormality of the left index finger per radiology. ____________________________________________   PROCEDURES  Procedure(s) performed:  Left index finger soaked in normal saline and Betadine solution. Bleeding well controlled. Superficial skin tear/laceration noted to the cuticle fold. Bacitracin was applied under a sterile dressing. An aluminum foam  splint was then applied for protection of the wound. The patient was neurovascularly intact post-application. ____________________________________________   INITIAL IMPRESSION / ASSESSMENT AND PLAN / ED COURSE  Pertinent labs & imaging results that were available during my care of the patient were reviewed by me and considered in my medical decision making (see chart for details).  She will be advised to primary care as needed. Wound care was discussed. Tetanus shot was updated tonight. She was also advised to return to the emergency department for symptoms that change or worsen if unable to schedule an appointment.  ____________________________________________   FINAL CLINICAL IMPRESSION(S) / ED DIAGNOSES  Final diagnoses:  Crushing injury of left index finger, initial encounter  Laceration of index finger of left hand without complication, initial encounter    Discharge Medication List as of 10/05/2015 10:05 PM       Chinita Pesterari B Kathie Posa, FNP 10/05/15 2222  Sharyn CreamerMark Quale, MD 10/05/15 2353

## 2015-10-05 NOTE — ED Notes (Signed)
Pt states that she slammed her left index finger in the truck door around 1630 today, pt states that she was hoping it would stop bleeding but it hasn't

## 2016-05-24 ENCOUNTER — Encounter: Payer: Self-pay | Admitting: *Deleted

## 2016-05-31 ENCOUNTER — Other Ambulatory Visit: Payer: Self-pay | Admitting: Surgery

## 2016-05-31 DIAGNOSIS — Q446 Cystic disease of liver: Secondary | ICD-10-CM

## 2016-05-31 DIAGNOSIS — Q613 Polycystic kidney, unspecified: Secondary | ICD-10-CM

## 2016-05-31 DIAGNOSIS — K4090 Unilateral inguinal hernia, without obstruction or gangrene, not specified as recurrent: Secondary | ICD-10-CM

## 2016-06-03 ENCOUNTER — Ambulatory Visit: Payer: Self-pay | Admitting: General Surgery

## 2016-06-03 ENCOUNTER — Other Ambulatory Visit: Payer: Self-pay | Admitting: Surgery

## 2016-06-03 ENCOUNTER — Ambulatory Visit
Admission: RE | Admit: 2016-06-03 | Discharge: 2016-06-03 | Disposition: A | Discharge status: Home / Self Care | Payer: BLUE CROSS/BLUE SHIELD | Source: Ambulatory Visit | Attending: Surgery | Admitting: Surgery

## 2016-06-03 DIAGNOSIS — K4090 Unilateral inguinal hernia, without obstruction or gangrene, not specified as recurrent: Secondary | ICD-10-CM

## 2016-06-03 DIAGNOSIS — Q446 Cystic disease of liver: Secondary | ICD-10-CM | POA: Diagnosis not present

## 2016-06-03 DIAGNOSIS — Q613 Polycystic kidney, unspecified: Secondary | ICD-10-CM | POA: Diagnosis not present

## 2016-07-26 ENCOUNTER — Encounter
Admission: RE | Admit: 2016-07-26 | Discharge: 2016-07-26 | Disposition: A | Discharge status: Home / Self Care | Payer: BLUE CROSS/BLUE SHIELD | Source: Ambulatory Visit | Attending: Surgery | Admitting: Surgery

## 2016-07-26 HISTORY — DX: Unspecified osteoarthritis, unspecified site: M19.90

## 2016-07-26 HISTORY — DX: Other complications of anesthesia, initial encounter: T88.59XA

## 2016-07-26 HISTORY — DX: Adverse effect of unspecified anesthetic, initial encounter: T41.45XA

## 2016-07-26 HISTORY — DX: Anemia, unspecified: D64.9

## 2016-07-26 NOTE — Patient Instructions (Signed)
  Your procedure is scheduled on: 08-03-16 Report to Same Day Surgery 2nd floor medical mall (Medical Mall Entrance-take elevator on left to 2nd floor.  Check in with surgery information desk.) To find out your arrival time please call [phone] between 1PM - 3PM on 08-02-16  Remember: Instructions that are not followed completely may result in serious medical risk, up to and including death, or upon the discretion of your surgeon and anesthesiologist your surgery may need to be rescheduled.    _x___ 1. Do not eat food or drink liquids after midnight. No gum chewing or                              hard candies.     __x__ 2. No Alcohol for 24 hours before or after surgery.   __x__3. No Smoking for 24 prior to surgery.   ____  4. Bring all medications with you on the day of surgery if instructed.    __x__ 5. Notify your doctor if there is any change in your medical condition     (cold, fever, infections).     Do not wear jewelry, make-up, hairpins, clips or nail polish.  Do not wear lotions, powders, or perfumes. You may wear deodorant.  Do not shave 48 hours prior to surgery. Men may shave face and neck.  Do not bring valuables to the hospital.    [facility] is not responsible for any belongings or valuables.               Contacts, dentures or bridgework may not be worn into surgery.  Leave your suitcase in the car. After surgery it may be brought to your room.  For patients admitted to the hospital, discharge time is determined by your                       treatment team.   Patients discharged the day of surgery will not be allowed to drive home.  You will need someone to drive you home and stay with you the night of your procedure.    Please read over the following fact sheets that you were given:   [place] [facility] and or MRSA Information   ____ Take anti-hypertensive (unless it includes a diuretic), cardiac, seizure, asthma,     anti-reflux and psychiatric  medicines. These include:  1. NONE  2.  3.  4.  5.  6.  ____Fleets enema or Magnesium Citrate as directed.   ____ Use CHG Soap or sage wipes as directed on instruction sheet   ____ Use inhalers on the day of surgery and bring to hospital day of surgery  ____ Stop Metformin and Janumet 2 days prior to surgery.    ____ Take 1/2 of usual insulin dose the night before surgery and none on the morning     surgery.   ____ Follow recommendations from Cardiologist, Pulmonologist or PCP regarding          stopping Aspirin, Coumadin, Pllavix ,Eliquis, Effient, or Pradaxa, and Pletal.  X____Stop Anti-inflammatories such as Advil, Aleve, Ibuprofen, Motrin, Naproxen, Naprosyn, Goodies powders or aspirin products NOW- OK to take Tylenol    ____ Stop supplements until after surgery.   ____ Bring C-Pap to the hospital.    

## 2016-07-30 MED ORDER — VANCOMYCIN HCL IN DEXTROSE 1-5 GM/200ML-% IV SOLN
INTRAVENOUS | Status: AC
  Filled 2016-07-30: qty 200

## 2016-07-30 MED ORDER — FAMOTIDINE 20 MG PO TABS
ORAL_TABLET | ORAL | Status: AC
  Filled 2016-07-30: qty 1

## 2016-08-02 ENCOUNTER — Encounter: Payer: Self-pay | Admitting: *Deleted

## 2016-08-02 MED ORDER — VANCOMYCIN HCL IN DEXTROSE 750-5 MG/150ML-% IV SOLN
750.0000 mg | Freq: Once | INTRAVENOUS | Status: DC
  Filled 2016-08-02: qty 150

## 2016-08-03 ENCOUNTER — Ambulatory Visit: Payer: BLUE CROSS/BLUE SHIELD | Admitting: Anesthesiology

## 2016-08-03 ENCOUNTER — Encounter
Admission: RE | Disposition: A | Discharge status: Home / Self Care | Payer: Self-pay | Source: Ambulatory Visit | Attending: Surgery

## 2016-08-03 ENCOUNTER — Ambulatory Visit
Admission: RE | Admit: 2016-08-03 | Discharge: 2016-08-03 | Disposition: A | Discharge status: Home / Self Care | Payer: BLUE CROSS/BLUE SHIELD | Source: Ambulatory Visit | Attending: Surgery | Admitting: Surgery

## 2016-08-03 ENCOUNTER — Encounter: Payer: Self-pay | Admitting: *Deleted

## 2016-08-03 DIAGNOSIS — Q613 Polycystic kidney, unspecified: Secondary | ICD-10-CM | POA: Diagnosis not present

## 2016-08-03 DIAGNOSIS — Z888 Allergy status to other drugs, medicaments and biological substances status: Secondary | ICD-10-CM | POA: Diagnosis not present

## 2016-08-03 DIAGNOSIS — Z79899 Other long term (current) drug therapy: Secondary | ICD-10-CM | POA: Diagnosis not present

## 2016-08-03 DIAGNOSIS — E785 Hyperlipidemia, unspecified: Secondary | ICD-10-CM | POA: Diagnosis not present

## 2016-08-03 DIAGNOSIS — K4090 Unilateral inguinal hernia, without obstruction or gangrene, not specified as recurrent: Secondary | ICD-10-CM | POA: Insufficient documentation

## 2016-08-03 DIAGNOSIS — Z9049 Acquired absence of other specified parts of digestive tract: Secondary | ICD-10-CM | POA: Diagnosis not present

## 2016-08-03 DIAGNOSIS — Z881 Allergy status to other antibiotic agents status: Secondary | ICD-10-CM | POA: Diagnosis not present

## 2016-08-03 DIAGNOSIS — Z90710 Acquired absence of both cervix and uterus: Secondary | ICD-10-CM | POA: Insufficient documentation

## 2016-08-03 DIAGNOSIS — D649 Anemia, unspecified: Secondary | ICD-10-CM | POA: Diagnosis not present

## 2016-08-03 DIAGNOSIS — K439 Ventral hernia without obstruction or gangrene: Secondary | ICD-10-CM | POA: Diagnosis not present

## 2016-08-03 DIAGNOSIS — M542 Cervicalgia: Secondary | ICD-10-CM | POA: Diagnosis not present

## 2016-08-03 HISTORY — PX: VENTRAL HERNIA REPAIR: SHX424

## 2016-08-03 HISTORY — DX: Disorder of kidney and ureter, unspecified: N28.9

## 2016-08-03 HISTORY — PX: INGUINAL HERNIA REPAIR: SHX194

## 2016-08-03 SURGERY — REPAIR, HERNIA, INGUINAL, ADULT
Anesthesia: General | Wound class: Clean Contaminated

## 2016-08-03 MED ORDER — BUPIVACAINE-EPINEPHRINE (PF) 0.5% -1:200000 IJ SOLN
INTRAMUSCULAR | Status: DC | PRN
  Administered 2016-08-03: 21 mL via PERINEURAL

## 2016-08-03 MED ORDER — LACTATED RINGERS IV SOLN
INTRAVENOUS | Status: DC
  Administered 2016-08-03: 07:00:00 via INTRAVENOUS

## 2016-08-03 MED ORDER — GLYCOPYRROLATE 0.2 MG/ML IJ SOLN
INTRAMUSCULAR | Status: AC
Start: 2016-08-03 — End: 2016-08-03
  Filled 2016-08-03: qty 3

## 2016-08-03 MED ORDER — FENTANYL CITRATE (PF) 100 MCG/2ML IJ SOLN
INTRAMUSCULAR | Status: DC | PRN
  Administered 2016-08-03: 100 ug via INTRAVENOUS
  Administered 2016-08-03 (×2): 50 ug via INTRAVENOUS

## 2016-08-03 MED ORDER — FAMOTIDINE 20 MG PO TABS
20.0000 mg | ORAL_TABLET | Freq: Once | ORAL | Status: AC
  Administered 2016-08-03: 20 mg via ORAL

## 2016-08-03 MED ORDER — NEOSTIGMINE METHYLSULFATE 10 MG/10ML IV SOLN
INTRAVENOUS | Status: DC | PRN
  Administered 2016-08-03: 3 mg via INTRAVENOUS

## 2016-08-03 MED ORDER — LIDOCAINE HCL (CARDIAC) 20 MG/ML IV SOLN
INTRAVENOUS | Status: DC | PRN
  Administered 2016-08-03: 50 mg via INTRAVENOUS

## 2016-08-03 MED ORDER — ACETAMINOPHEN 10 MG/ML IV SOLN
INTRAVENOUS | Status: DC | PRN
  Administered 2016-08-03: 1000 mg via INTRAVENOUS

## 2016-08-03 MED ORDER — PHENYLEPHRINE HCL 10 MG/ML IJ SOLN
INTRAMUSCULAR | Status: AC
Start: 2016-08-03 — End: 2016-08-03
  Filled 2016-08-03: qty 1

## 2016-08-03 MED ORDER — ONDANSETRON HCL 4 MG/2ML IJ SOLN
INTRAMUSCULAR | Status: DC | PRN
Start: 2016-08-03 — End: 2016-08-03
  Administered 2016-08-03: 4 mg via INTRAVENOUS

## 2016-08-03 MED ORDER — FENTANYL CITRATE (PF) 100 MCG/2ML IJ SOLN
INTRAMUSCULAR | Status: AC
  Filled 2016-08-03: qty 2

## 2016-08-03 MED ORDER — PROPOFOL 10 MG/ML IV BOLUS
INTRAVENOUS | Status: DC | PRN
  Administered 2016-08-03: 100 mg via INTRAVENOUS

## 2016-08-03 MED ORDER — FAMOTIDINE 20 MG PO TABS
ORAL_TABLET | ORAL | Status: AC
  Administered 2016-08-03: 20 mg via ORAL
  Filled 2016-08-03: qty 1

## 2016-08-03 MED ORDER — ONDANSETRON HCL 4 MG/2ML IJ SOLN
INTRAMUSCULAR | Status: AC
  Administered 2016-08-03: 4 mg via INTRAVENOUS
  Filled 2016-08-03: qty 2

## 2016-08-03 MED ORDER — VANCOMYCIN HCL 1000 MG IV SOLR
750.0000 mg | Freq: Once | INTRAVENOUS | Status: DC
  Filled 2016-08-03: qty 750

## 2016-08-03 MED ORDER — DEXAMETHASONE SODIUM PHOSPHATE 10 MG/ML IJ SOLN
INTRAMUSCULAR | Status: AC
  Filled 2016-08-03: qty 1

## 2016-08-03 MED ORDER — HYDROCODONE-ACETAMINOPHEN 5-325 MG PO TABS: 1.0000 | ORAL_TABLET | ORAL | 0 refills | Status: DC | PRN

## 2016-08-03 MED ORDER — VANCOMYCIN HCL 1000 MG IV SOLR
750.0000 mg | Freq: Once | INTRAVENOUS | Status: AC
  Administered 2016-08-03: 750 mg via INTRAVENOUS
  Filled 2016-08-03: qty 750

## 2016-08-03 MED ORDER — PROMETHAZINE HCL 25 MG/ML IJ SOLN
INTRAMUSCULAR | Status: AC
  Administered 2016-08-03: 6.25 mg
  Filled 2016-08-03: qty 1

## 2016-08-03 MED ORDER — PROMETHAZINE HCL 25 MG/ML IJ SOLN
6.2500 mg | Freq: Once | INTRAMUSCULAR | Status: DC
  Filled 2016-08-03: qty 1

## 2016-08-03 MED ORDER — PROPOFOL 10 MG/ML IV BOLUS
INTRAVENOUS | Status: AC
  Filled 2016-08-03: qty 20

## 2016-08-03 MED ORDER — ONDANSETRON HCL 4 MG/2ML IJ SOLN
4.0000 mg | Freq: Once | INTRAMUSCULAR | Status: AC | PRN
  Administered 2016-08-03: 4 mg via INTRAVENOUS

## 2016-08-03 MED ORDER — FENTANYL CITRATE (PF) 100 MCG/2ML IJ SOLN
25.0000 ug | INTRAMUSCULAR | Status: DC | PRN
  Administered 2016-08-03 (×2): 25 ug via INTRAVENOUS

## 2016-08-03 MED ORDER — GLYCOPYRROLATE 0.2 MG/ML IJ SOLN
INTRAMUSCULAR | Status: DC | PRN
  Administered 2016-08-03: 0.4 mg via INTRAVENOUS

## 2016-08-03 MED ORDER — ROCURONIUM BROMIDE 50 MG/5ML IV SOLN
INTRAVENOUS | Status: AC
Start: 2016-08-03 — End: 2016-08-03
  Filled 2016-08-03: qty 1

## 2016-08-03 MED ORDER — ACETAMINOPHEN 10 MG/ML IV SOLN
INTRAVENOUS | Status: AC
Start: 2016-08-03 — End: 2016-08-03
  Filled 2016-08-03: qty 100

## 2016-08-03 MED ORDER — FENTANYL CITRATE (PF) 100 MCG/2ML IJ SOLN
INTRAMUSCULAR | Status: AC
  Administered 2016-08-03: 25 ug via INTRAVENOUS
  Filled 2016-08-03: qty 2

## 2016-08-03 MED ORDER — ONDANSETRON HCL 4 MG/2ML IJ SOLN
INTRAMUSCULAR | Status: AC
  Filled 2016-08-03: qty 2

## 2016-08-03 MED ORDER — HYDROCODONE-ACETAMINOPHEN 5-325 MG PO TABS
1.0000 | ORAL_TABLET | ORAL | Status: DC | PRN
Start: 2016-08-03 — End: 2016-08-03

## 2016-08-03 MED ORDER — ROCURONIUM BROMIDE 100 MG/10ML IV SOLN
INTRAVENOUS | Status: DC | PRN
  Administered 2016-08-03: 20 mg via INTRAVENOUS
  Administered 2016-08-03: 30 mg via INTRAVENOUS

## 2016-08-03 MED ORDER — SUCCINYLCHOLINE CHLORIDE 20 MG/ML IJ SOLN
INTRAMUSCULAR | Status: AC
Start: 2016-08-03 — End: 2016-08-03
  Filled 2016-08-03: qty 1

## 2016-08-03 MED ORDER — SODIUM CHLORIDE 0.9 % IJ SOLN
INTRAMUSCULAR | Status: AC
  Filled 2016-08-03: qty 20

## 2016-08-03 MED ORDER — NEOSTIGMINE METHYLSULFATE 5 MG/5ML IV SOSY
PREFILLED_SYRINGE | INTRAVENOUS | Status: AC
  Filled 2016-08-03: qty 5

## 2016-08-03 MED ORDER — LIDOCAINE HCL (PF) 2 % IJ SOLN
INTRAMUSCULAR | Status: AC
  Filled 2016-08-03: qty 2

## 2016-08-03 MED ORDER — BUPIVACAINE-EPINEPHRINE (PF) 0.5% -1:200000 IJ SOLN
INTRAMUSCULAR | Status: AC
  Filled 2016-08-03: qty 30

## 2016-08-03 SURGICAL SUPPLY — 28 items
ADH SKN CLS APL DERMABOND .7 (GAUZE/BANDAGES/DRESSINGS) ×2
BLADE SURG 15 STRL LF DISP TIS (BLADE) ×2 IMPLANT
BLADE SURG 15 STRL SS (BLADE) ×3
CANISTER SUCT 1200ML W/VALVE (MISCELLANEOUS) ×3 IMPLANT
CHLORAPREP W/TINT 26ML (MISCELLANEOUS) ×3 IMPLANT
DERMABOND ADVANCED (GAUZE/BANDAGES/DRESSINGS) ×1
DERMABOND ADVANCED .7 DNX12 (GAUZE/BANDAGES/DRESSINGS) ×2 IMPLANT
DRAIN PENROSE 5/8X18 LTX STRL (WOUND CARE) ×3 IMPLANT
DRAPE LAPAROTOMY 77X122 PED (DRAPES) ×3 IMPLANT
ELECT REM PT RETURN 9FT ADLT (ELECTROSURGICAL) ×3
ELECTRODE REM PT RTRN 9FT ADLT (ELECTROSURGICAL) ×2 IMPLANT
GLOVE BIO SURGEON STRL SZ7.5 (GLOVE) ×3 IMPLANT
GOWN STRL REUS W/ TWL LRG LVL3 (GOWN DISPOSABLE) ×6 IMPLANT
GOWN STRL REUS W/TWL LRG LVL3 (GOWN DISPOSABLE) ×9
KIT RM TURNOVER STRD PROC AR (KITS) ×3 IMPLANT
LABEL OR SOLS (LABEL) ×3 IMPLANT
MESH SYNTHETIC 4X6 SOFT BARD (Mesh General) ×2 IMPLANT
MESH SYNTHETIC SOFT BARD 4X6 (Mesh General) ×1 IMPLANT
NDL HYPO 25X1 1.5 SAFETY (NEEDLE) ×2 IMPLANT
NEEDLE HYPO 25X1 1.5 SAFETY (NEEDLE) ×3 IMPLANT
NS IRRIG 500ML POUR BTL (IV SOLUTION) ×3 IMPLANT
PACK BASIN MINOR ARMC (MISCELLANEOUS) ×3 IMPLANT
SUT CHROMIC 4 0 RB 1X27 (SUTURE) ×3 IMPLANT
SUT MNCRL AB 4-0 PS2 18 (SUTURE) ×4 IMPLANT
SUT SURGILON 0 30 BLK (SUTURE) ×9 IMPLANT
SUT VIC AB 4-0 SH 27 (SUTURE) ×12
SUT VIC AB 4-0 SH 27XANBCTRL (SUTURE) ×4 IMPLANT
SYRINGE 10CC LL (SYRINGE) ×3 IMPLANT

## 2016-08-03 NOTE — Discharge Instructions (Addendum)
AMBULATORY SURGERY  DISCHARGE INSTRUCTIONS   1) The drugs that you were given will stay in your system until tomorrow so for the next 24 hours you should not:  A) Drive an automobile B) Make any legal decisions C) Drink any alcoholic beverage   2) You may resume regular meals tomorrow.  Today it is better to start with liquids and gradually work up to solid foods.  You may eat anything you prefer, but it is better to start with liquids, then soup and crackers, and gradually work up to solid foods.   3) Please notify your doctor immediately if you have any unusual bleeding, trouble breathing, redness and pain at the surgery site, drainage, fever, or pain not relieved by medication. 4)   5) Your post-operative visit with Dr.                                     is: Date:                        Time:    Please call to schedule your post-operative visit.  6) Additional Instructions:    Take Tylenol or Norco if needed for pain.  Should not drive or do anything dangerous when taking Norco.  May shower and blot dry.  Avoid straining and heavy lifting.

## 2016-08-03 NOTE — Anesthesia Procedure Notes (Signed)
Procedure Name: Intubation Date/Time: 08/03/2016 7:42 AM Performed by: Omer Jack Pre-anesthesia Checklist: Patient identified, Patient being monitored, Timeout performed, Emergency Drugs available and Suction available Patient Re-evaluated:Patient Re-evaluated prior to inductionOxygen Delivery Method: Circle system utilized Preoxygenation: Pre-oxygenation with 100% oxygen Intubation Type: IV induction Ventilation: Mask ventilation without difficulty Laryngoscope Size: Miller and 2 Grade View: Grade I Tube type: Oral Tube size: 7.0 mm Number of attempts: 1 Placement Confirmation: ETT inserted through vocal cords under direct vision,  positive ETCO2 and breath sounds checked- equal and bilateral Secured at: 21 cm Tube secured with: Tape Dental Injury: Teeth and Oropharynx as per pre-operative assessment

## 2016-08-03 NOTE — OR Nursing (Signed)
Dr. Katrinka Blazing in to see patient - aware pt received zofran, phenergan for nausea

## 2016-08-03 NOTE — Anesthesia Postprocedure Evaluation (Signed)
Anesthesia Post Note  Patient: Jamie Foley  Procedure(s) Performed: Procedure(s) (LRB): HERNIA REPAIR INGUINAL ADULT (Left) HERNIA REPAIR VENTRAL ADULT (N/A)  Patient location during evaluation: PACU Anesthesia Type: General Level of consciousness: awake and alert Pain management: pain level controlled Vital Signs Assessment: post-procedure vital signs reviewed and stable Respiratory status: spontaneous breathing and respiratory function stable Cardiovascular status: stable Anesthetic complications: no     Last Vitals:  Vitals:   08/03/16 0611 08/03/16 0956  BP: (!) 146/94   Pulse: 77   Resp: 20   Temp:  36.3 C    Last Pain:  Vitals:   08/03/16 0956  PainSc: Asleep                 KEPHART,WILLIAM K

## 2016-08-03 NOTE — Op Note (Signed)
OPERATIVE REPORT  PREOPERATIVE DIAGNOSIS: left inguinal hernia, 2 ventral hernias  POSTOPERATIVE DIAGNOSIS:left  inguinal hernia, 2 ventral hernias  PROCEDURE:  left inguinal hernia repair, 2 ventral hernia repairs  ANESTHESIA:  General  SURGEON:  Renda Rolls M.D.  INDICATIONS: She has history of bulging in the left groin. She also has had previous epigastric ventral hernia repair in the midline just above the navel and has had recent recurrence of bulging at the site. She is also had laparoscopic cholecystectomy and has developed bulging at the epigastric port site. On physical exam she had findings of a left inguinal hernia and also two ventral hernias which were small in size. Surgery was recommended for definitive treatment.  With the patient on the operating table in the supine position the abdomen was prepared with clippers and with ChloraPrep and draped in a sterile manner. A transversely oriented left suprapubic incision was made and carried down through subcutaneous tissues. Electrocautery was used for hemostasis. The Scarpa's fascia was incised. The external oblique aponeurosis was incised along the course of its fibers to open the external ring. There was a finding of  an indirect inguinal hernia. The round ligament was identified and was ligated distally with 4-0 chromic and divided. The hernia sac was dissected free from surrounding structures up to the internal ring. The sac was opened. Its continuity with the peritoneal cavity was demonstrated. A high ligation of the sac was done with a 4-0 Vicryl suture ligature and the sac was excised along with a remnant of round ligament and was not submitted for pathology. A cord lipoma was ligated with 4-0 chromic and amputated and was not submitted for pathology. The repair was carried out by suturing the conjoined tendon to the shelving edge of the inguinal ligament using 0 Surgilon sutures the repair began at the pubic tubercle and was carried  up to obliterate the internal ring.  The cut edges of the external oblique aponeurosis were closed with a running 4-0 Vicryl suture to re-create the external ring. The deep fascia superior and lateral to the repair site was infiltrated with half percent Sensorcaine with epinephrine. Subcutaneous tissues were also infiltrated. The Scarpa's fascia was closed with interrupted 4-0 Vicryl sutures. The skin was closed with running 4-0 Monocryl subcuticular suture.  Next the supraumbilical epigastric ventral hernia was repaired. A longitudinally oriented incision was made some 3 cm in length above the umbilicus and carried down through subcutaneous tissues to encounter a ventral hernia. There was herniated properitoneal fat which was dissected free from surrounding structures and was reduced back into the abdominal cavity. The fascial ring defect was examined. The fascia was found to be thin. The defect was approximately 2.5 cm in dimension. Properitoneal Bard soft mesh was used cutting a portion of mesh 2.5 x 3.5 cm in dimension and was placed into the properitoneal plane oriented transversely. This was sutured to the overlying fascia with 0 Surgilon through and through sutures with 4 point fixation. Next the fascial ring defect was closed with a transversely oriented suture line of interrupted 0 Surgilon figure-of-eight sutures incorporating each suture into the mesh. The deep fascia and subcutaneous tissues were infiltrated with half percent Sensorcaine with epinephrine. Subcutaneous tissues were approximated with 4-0 Vicryl. The skin was closed with running 4-0 Monocryl subcuticular suture.   The third incision was made at the site of an old scar which was oriented transversely in subxiphoid region of the epigastrium. Dissection was carried down through subcutaneous tissues to encounter a ventral  hernia consisting of herniated properitoneal fat. This was dissected free from surrounding tissues and was separated  from the fascial ring defect and was reduced back into the abdominal cavity. The fascia appeared to be thin. The fascial ring defect was approximately 1.4 cm in dimension. Bard soft mesh was cut to create an oval shape of 2 x 3 cm and was placed into the properitoneal plane oriented transversely and was sutured to the overlying fascia with through and through 0 Surgilon. The repair was then carried out with a transversely oriented suture line of interrupted 0 Surgilon figure-of-eight sutures incorporating each suture into the mesh. The deep fascia and subcutaneous tissues were infiltrated with half percent Sensorcaine with epinephrine. Subcutaneous tissues were approximated with 4-0 Vicryl. The skin was closed with running 4-0 Monocryl subcuticular suture.  Electrocautery was used during the course of surgery to control bleeding points. Estimated blood loss was 4 cc. All 3 incisions were dressed with Dermabond and allowed to dry. The patient tolerated surgery satisfactorily and was then prepared for transfer to the recovery room  Renda Rolls M.D. The patient appeared to be in satisfactory condition and was prepared for transfer to the recovery room.  Renda Rolls M.D.

## 2016-08-03 NOTE — Anesthesia Post-op Follow-up Note (Cosign Needed)
Anesthesia QCDR form completed.        

## 2016-08-03 NOTE — H&P (Signed)
  She comes today for repair of a left inguinal hernia and two ventral hernias.  She reports no change in condition since office exam.  Labs noted.  Left side marked YES.  Site of ventral hernias examined and currently reduced.

## 2016-08-03 NOTE — Anesthesia Preprocedure Evaluation (Signed)
Anesthesia Evaluation  Patient identified by MRN, date of birth, ID band Patient awake    Reviewed: Allergy & Precautions, NPO status , Patient's Chart, lab work & pertinent test results  History of Anesthesia Complications (+) PONVNegative for: history of anesthetic complications  Airway Mallampati: II  TM Distance: <3 FB     Dental   Pulmonary neg pulmonary ROS,           Cardiovascular negative cardio ROS       Neuro/Psych    GI/Hepatic negative GI ROS, Neg liver ROS,   Endo/Other  negative endocrine ROS  Renal/GU Renal disease (polycystic kidney dz)     Musculoskeletal   Abdominal   Peds  Hematology  (+) anemia ,   Anesthesia Other Findings   Reproductive/Obstetrics                             Anesthesia Physical Anesthesia Plan  ASA: II  Anesthesia Plan: General   Post-op Pain Management:    Induction: Intravenous  Airway Management Planned: LMA  Additional Equipment:   Intra-op Plan:   Post-operative Plan:   Informed Consent: I have reviewed the patients History and Physical, chart, labs and discussed the procedure including the risks, benefits and alternatives for the proposed anesthesia with the patient or authorized representative who has indicated his/her understanding and acceptance.     Plan Discussed with:   Anesthesia Plan Comments:         Anesthesia Quick Evaluation

## 2016-08-03 NOTE — Transfer of Care (Signed)
Immediate Anesthesia Transfer of Care Note  Patient: Jamie Foley  Procedure(s) Performed: Procedure(s): HERNIA REPAIR INGUINAL ADULT (Left) HERNIA REPAIR VENTRAL ADULT (N/A)  Patient Location: PACU  Anesthesia Type:General  Level of Consciousness: patient cooperative and lethargic  Airway & Oxygen Therapy: Patient Spontanous Breathing and Patient connected to face mask oxygen  Post-op Assessment: Report given to RN and Post -op Vital signs reviewed and stable  Post vital signs: Reviewed and stable  Last Vitals:  Vitals:   08/03/16 0611 08/03/16 0956  BP: (!) 146/94   Pulse: 77   Resp: 20   Temp:  36.3 C    Last Pain: There were no vitals filed for this visit.       Complications: No apparent anesthesia complications

## 2016-08-11 ENCOUNTER — Encounter: Payer: Self-pay | Admitting: Surgery

## 2018-09-20 ENCOUNTER — Inpatient Hospital Stay
Admission: EM | Admit: 2018-09-20 | Discharge: 2018-09-24 | DRG: 392 | Disposition: A | Discharge status: Home / Self Care | Payer: BC Managed Care – PPO | Attending: Internal Medicine | Admitting: Internal Medicine

## 2018-09-20 ENCOUNTER — Other Ambulatory Visit: Payer: Self-pay

## 2018-09-20 ENCOUNTER — Emergency Department: Payer: BC Managed Care – PPO

## 2018-09-20 DIAGNOSIS — A059 Bacterial foodborne intoxication, unspecified: Principal | ICD-10-CM | POA: Diagnosis present

## 2018-09-20 DIAGNOSIS — D631 Anemia in chronic kidney disease: Secondary | ICD-10-CM | POA: Diagnosis present

## 2018-09-20 DIAGNOSIS — N179 Acute kidney failure, unspecified: Secondary | ICD-10-CM

## 2018-09-20 DIAGNOSIS — E860 Dehydration: Secondary | ICD-10-CM | POA: Diagnosis not present

## 2018-09-20 DIAGNOSIS — R112 Nausea with vomiting, unspecified: Secondary | ICD-10-CM

## 2018-09-20 DIAGNOSIS — M1990 Unspecified osteoarthritis, unspecified site: Secondary | ICD-10-CM | POA: Diagnosis present

## 2018-09-20 DIAGNOSIS — I129 Hypertensive chronic kidney disease with stage 1 through stage 4 chronic kidney disease, or unspecified chronic kidney disease: Secondary | ICD-10-CM | POA: Diagnosis present

## 2018-09-20 DIAGNOSIS — N183 Chronic kidney disease, stage 3 (moderate): Secondary | ICD-10-CM | POA: Diagnosis present

## 2018-09-20 DIAGNOSIS — R05 Cough: Secondary | ICD-10-CM

## 2018-09-20 DIAGNOSIS — K7689 Other specified diseases of liver: Secondary | ICD-10-CM | POA: Diagnosis present

## 2018-09-20 DIAGNOSIS — M810 Age-related osteoporosis without current pathological fracture: Secondary | ICD-10-CM | POA: Diagnosis present

## 2018-09-20 DIAGNOSIS — Q612 Polycystic kidney, adult type: Secondary | ICD-10-CM

## 2018-09-20 DIAGNOSIS — K529 Noninfective gastroenteritis and colitis, unspecified: Secondary | ICD-10-CM | POA: Diagnosis present

## 2018-09-20 DIAGNOSIS — R197 Diarrhea, unspecified: Secondary | ICD-10-CM

## 2018-09-20 DIAGNOSIS — R059 Cough, unspecified: Secondary | ICD-10-CM

## 2018-09-20 DIAGNOSIS — Z881 Allergy status to other antibiotic agents status: Secondary | ICD-10-CM

## 2018-09-20 DIAGNOSIS — Z888 Allergy status to other drugs, medicaments and biological substances status: Secondary | ICD-10-CM

## 2018-09-20 DIAGNOSIS — E876 Hypokalemia: Secondary | ICD-10-CM | POA: Diagnosis present

## 2018-09-20 DIAGNOSIS — Z20828 Contact with and (suspected) exposure to other viral communicable diseases: Secondary | ICD-10-CM | POA: Diagnosis present

## 2018-09-20 LAB — COMPREHENSIVE METABOLIC PANEL
ALT: 17 U/L (ref 0–44)
AST: 17 U/L (ref 15–41)
Albumin: 3.5 g/dL (ref 3.5–5.0)
Alkaline Phosphatase: 57 U/L (ref 38–126)
Anion gap: 12 (ref 5–15)
BUN: 34 mg/dL — ABNORMAL HIGH (ref 8–23)
CO2: 25 mmol/L (ref 22–32)
Calcium: 8.3 mg/dL — ABNORMAL LOW (ref 8.9–10.3)
Chloride: 97 mmol/L — ABNORMAL LOW (ref 98–111)
Creatinine, Ser: 1.79 mg/dL — ABNORMAL HIGH (ref 0.44–1.00)
GFR calc Af Amer: 34 mL/min — ABNORMAL LOW (ref >60)
GFR calc non Af Amer: 30 mL/min — ABNORMAL LOW (ref >60)
Glucose, Bld: 120 mg/dL — ABNORMAL HIGH (ref 70–99)
Potassium: 3.1 mmol/L — ABNORMAL LOW (ref 3.5–5.1)
Sodium: 134 mmol/L — ABNORMAL LOW (ref 135–145)
Total Bilirubin: 1.7 mg/dL — ABNORMAL HIGH (ref 0.3–1.2)
Total Protein: 7.4 g/dL (ref 6.5–8.1)

## 2018-09-20 LAB — URINALYSIS, COMPLETE (UACMP) WITH MICROSCOPIC
Bacteria, UA: NONE SEEN
Bilirubin Urine: NEGATIVE
Glucose, UA: NEGATIVE mg/dL
Ketones, ur: 20 mg/dL — AB
Leukocytes,Ua: NEGATIVE
Nitrite: NEGATIVE
Protein, ur: 30 mg/dL — AB
Specific Gravity, Urine: 1.012 (ref 1.005–1.030)
Squamous Epithelial / LPF: NONE SEEN (ref 0–5)
pH: 5 (ref 5.0–8.0)

## 2018-09-20 LAB — CBC WITH DIFFERENTIAL/PLATELET
Abs Immature Granulocytes: 0.07 10*3/uL (ref 0.00–0.07)
Basophils Absolute: 0 10*3/uL (ref 0.0–0.1)
Basophils Relative: 0 %
Eosinophils Absolute: 0.1 10*3/uL (ref 0.0–0.5)
Eosinophils Relative: 1 %
HCT: 34.7 % — ABNORMAL LOW (ref 36.0–46.0)
Hemoglobin: 11.2 g/dL — ABNORMAL LOW (ref 12.0–15.0)
Immature Granulocytes: 1 %
Lymphocytes Relative: 3 %
Lymphs Abs: 0.3 10*3/uL — ABNORMAL LOW (ref 0.7–4.0)
MCH: 28.6 pg (ref 26.0–34.0)
MCHC: 32.3 g/dL (ref 30.0–36.0)
MCV: 88.7 fL (ref 80.0–100.0)
Monocytes Absolute: 1.2 10*3/uL — ABNORMAL HIGH (ref 0.1–1.0)
Monocytes Relative: 11 %
Neutro Abs: 9.1 10*3/uL — ABNORMAL HIGH (ref 1.7–7.7)
Neutrophils Relative %: 84 %
Platelets: 88 10*3/uL — ABNORMAL LOW (ref 150–400)
RBC: 3.91 MIL/uL (ref 3.87–5.11)
RDW: 12.2 % (ref 11.5–15.5)
WBC: 10.7 10*3/uL — ABNORMAL HIGH (ref 4.0–10.5)
nRBC: 0 % (ref 0.0–0.2)

## 2018-09-20 LAB — LIPASE, BLOOD: Lipase: 29 U/L (ref 11–51)

## 2018-09-20 LAB — SARS CORONAVIRUS 2 BY RT PCR (HOSPITAL ORDER, PERFORMED IN ~~LOC~~ HOSPITAL LAB): SARS Coronavirus 2: NEGATIVE

## 2018-09-20 LAB — MAGNESIUM: Magnesium: 1.8 mg/dL (ref 1.7–2.4)

## 2018-09-20 MED ORDER — ALBUTEROL SULFATE (2.5 MG/3ML) 0.083% IN NEBU: 2.5000 mg | INHALATION_SOLUTION | RESPIRATORY_TRACT | Status: DC | PRN

## 2018-09-20 MED ORDER — ONDANSETRON HCL 4 MG/2ML IJ SOLN
4.0000 mg | Freq: Once | INTRAMUSCULAR | Status: AC
  Administered 2018-09-20: 4 mg via INTRAVENOUS
  Filled 2018-09-20: qty 2

## 2018-09-20 MED ORDER — HEPARIN SODIUM (PORCINE) 5000 UNIT/ML IJ SOLN
5000.0000 [IU] | Freq: Three times a day (TID) | INTRAMUSCULAR | Status: DC
  Administered 2018-09-20 – 2018-09-23 (×10): 5000 [IU] via SUBCUTANEOUS
  Filled 2018-09-20 (×10): qty 1

## 2018-09-20 MED ORDER — ACETAMINOPHEN 325 MG PO TABS
650.0000 mg | ORAL_TABLET | Freq: Four times a day (QID) | ORAL | Status: DC | PRN
  Administered 2018-09-21 – 2018-09-22 (×3): 650 mg via ORAL
  Filled 2018-09-20 (×3): qty 2

## 2018-09-20 MED ORDER — MORPHINE SULFATE (PF) 4 MG/ML IV SOLN
4.0000 mg | Freq: Once | INTRAVENOUS | Status: AC
  Administered 2018-09-20: 4 mg via INTRAVENOUS
  Filled 2018-09-20: qty 1

## 2018-09-20 MED ORDER — ONDANSETRON HCL 4 MG/2ML IJ SOLN: 4.0000 mg | Freq: Four times a day (QID) | INTRAMUSCULAR | Status: DC | PRN

## 2018-09-20 MED ORDER — ONDANSETRON HCL 4 MG PO TABS: 4.0000 mg | ORAL_TABLET | Freq: Four times a day (QID) | ORAL | Status: DC | PRN

## 2018-09-20 MED ORDER — HYDROCODONE-ACETAMINOPHEN 5-325 MG PO TABS: 1.0000 | ORAL_TABLET | ORAL | Status: DC | PRN

## 2018-09-20 MED ORDER — SODIUM CHLORIDE 0.9 % IV BOLUS
1000.0000 mL | Freq: Once | INTRAVENOUS | Status: AC
  Administered 2018-09-20: 1000 mL via INTRAVENOUS

## 2018-09-20 MED ORDER — ACETAMINOPHEN 650 MG RE SUPP: 650.0000 mg | Freq: Four times a day (QID) | RECTAL | Status: DC | PRN

## 2018-09-20 MED ORDER — ACETAMINOPHEN 325 MG PO TABS
650.0000 mg | ORAL_TABLET | Freq: Once | ORAL | Status: AC
  Administered 2018-09-20: 650 mg via ORAL
  Filled 2018-09-20: qty 2

## 2018-09-20 MED ORDER — POTASSIUM CHLORIDE CRYS ER 20 MEQ PO TBCR
40.0000 meq | EXTENDED_RELEASE_TABLET | Freq: Once | ORAL | Status: AC
  Administered 2018-09-20: 40 meq via ORAL
  Filled 2018-09-20: qty 2

## 2018-09-20 MED ORDER — POTASSIUM CHLORIDE IN NACL 40-0.9 MEQ/L-% IV SOLN
INTRAVENOUS | Status: DC
  Administered 2018-09-20 – 2018-09-23 (×5): 100 mL/h via INTRAVENOUS
  Filled 2018-09-20 (×11): qty 1000

## 2018-09-20 NOTE — ED Triage Notes (Signed)
Pt c/o pain at her abd hernia with watery diarrhea/N/V for the past 3 days.

## 2018-09-20 NOTE — ED Notes (Signed)
PA notified of fever

## 2018-09-20 NOTE — Progress Notes (Signed)
Advanced Care Plan.  Purpose of Encounter: CODE STATUS. Parties in Attendance: The patient and me. Patient's Decisional Capacity: Yes. Medical Story: Jamie Foley  is a 63 y.o. female with a known history of as below.  The patient has had abdominal pain, nausea, vomiting and diarrhea for the past 3 days.    The patient is being admitted for abdominal pain, nausea, vomiting and diarrhea, possible acute gastroenteritis but need to rule out C. difficile colitis.  I discussed with patient about her current condition, prognosis and CODE STATUS.  The patient hesitated to decide full code or DNR.  She finally decided that she wants to be resuscitated and intubated if she has cardiopulmonary arrest. Plan:  Code Status: Full code. Time spent discussing advance care planning: 17 minutes.

## 2018-09-20 NOTE — ED Notes (Signed)
ED TO INPATIENT HANDOFF REPORT  ED Nurse Name and Phone #: Shanda Bumpsjessica [phone]  S Name/Age/Gender Jamie Foley 63 y.o. female Room/Bed: ED18A/ED18A  Code Status   Code Status: Not on file  Home/SNF/Other Home Patient oriented to: self, place, time and situation Is this baseline? Yes   Triage Complete: Triage complete  Chief Complaint abd pain vomiting  Triage Note Pt c/o pain at her abd hernia with watery diarrhea/N/V for the past 3 days.   Allergies Allergies  Allergen Reactions  . Augmentin [Amoxicillin-Pot Clavulanate] Nausea Only and Other (See Comments)    Stomach pain   . Levofloxacin Other (See Comments)  . Prednisone Nausea And Vomiting and Other (See Comments)    Doubled over with stomach pain and couldn't sleep    Level of Care/Admitting Diagnosis ED Disposition    ED Disposition Condition Comment   Admit  [facility]ospital [facility] [[id number]]  Level of Care: Med-Surg [16]  Covid Evaluation: Confirmed COVID Negative  Diagnosis: Gastroenteritis [[id number]][[id number]]  Admitting Physician: Shaune PollackHEN, QING [[id number]]  Attending Physician: Shaune PollackHEN, QING [phone][[id number]]  PT Class (Do Not Modify): Observation [104]  PT Acc Code (Do Not Modify): Observation [10022]       B Medical/Surgery History Past Medical History:  Diagnosis Date  . Anemia   . Arthritis   . Complication of anesthesia    HARD TO WAKE UP AFTER ANKLE SURGERY-FELT LIKE SHE RECEIVED TO MUCH ANESTHESIA  . Osteoporosis   . Polycystic kidney disease   . Renal insufficiency    Past Surgical History:  Procedure Laterality Date  . ABDOMINAL HYSTERECTOMY    . CATARACT EXTRACTION W/PHACO Left 10/14/2014   Procedure: CATARACT EXTRACTION PHACO AND INTRAOCULAR LENS PLACEMENT (IOC);  Surgeon: Sallee LangeSteven Dingeldein, MD;  Location: [facility] ORS;  Service: Ophthalmology;  Laterality: Left;  US 01:09 AP% 60.0 CDE 37.21 fluid pack lot #[id number]  . CHOLECYSTECTOMY    . EYE SURGERY Left    DETACHED RETINA   . HERNIA  REPAIR    . INGUINAL HERNIA REPAIR Left 08/03/2016   Procedure: HERNIA REPAIR INGUINAL ADULT;  Surgeon: Nadeen LandauSmith, Jarvis Wilton, MD;  Location: [facility] ORS;  Service: General;  Laterality: Left;  . right ankle fracture Right   . VENTRAL HERNIA REPAIR N/A 08/03/2016   Procedure: HERNIA REPAIR VENTRAL ADULT;  Surgeon: Nadeen LandauSmith, Jarvis Wilton, MD;  Location: [facility] ORS;  Service: General;  Laterality: N/A;     A IV Location/Drains/Wounds Patient Lines/Drains/Airways Status   Active Line/Drains/Airways    Name:   Placement date:   Placement time:   Site:   Days:   Peripheral IV 09/20/18 Left Antecubital   09/20/18    1311    Antecubital   less than 1   Incision (Closed) 08/03/16 Groin Left   08/03/16    0803     778   Incision (Closed) 08/03/16 Abdomen Other (Comment)   08/03/16    0803     778          Intake/Output Last 24 hours No intake or output data in the 24 hours ending 09/20/18 1702  Labs/Imaging Results for orders placed or performed during the hospital encounter of 09/20/18 (from the past 48 hour(s))  Comprehensive metabolic panel     Status: Abnormal   Collection Time: 09/20/18  1:15 PM  Result Value Ref Range   Sodium 134 (L) 135 - 145 mmol/L   Potassium 3.1 (L) 3.5 - 5.1 mmol/L   Chloride 97 (L) 98 - 111 mmol/L  CO2 25 22 - 32 mmol/L   Glucose, Bld 120 (H) 70 - 99 mg/dL   BUN 34 (H) 8 - 23 mg/dL   Creatinine, Ser 1.611.79 (H) 0.44 - 1.00 mg/dL   Calcium 8.3 (L) 8.9 - 10.3 mg/dL   Total Protein 7.4 6.5 - 8.1 g/dL   Albumin 3.5 3.5 - 5.0 g/dL   AST 17 15 - 41 U/L   ALT 17 0 - 44 U/L   Alkaline Phosphatase 57 38 - 126 U/L   Total Bilirubin 1.7 (H) 0.3 - 1.2 mg/dL   GFR calc non Af Amer 30 (L) >60 mL/min   GFR calc Af Amer 34 (L) >60 mL/min   Anion gap 12 5 - 15    Comment: Performed at [facility], [address] [place], [place] [address]  CBC with Differential     Status: Abnormal   Collection Time: 09/20/18  1:15 PM  Result Value Ref Range   WBC 10.7 (H) 4.0 -  10.5 K/uL   RBC 3.91 3.87 - 5.11 MIL/uL   Hemoglobin 11.2 (L) 12.0 - 15.0 g/dL   HCT 54.034.7 (L) 98.136.0 - 19.146.0 %   MCV 88.7 80.0 - 100.0 fL   MCH 28.6 26.0 - 34.0 pg   MCHC 32.3 30.0 - 36.0 g/dL   RDW 47.812.2 29.511.5 - 62.115.5 %   Platelets 88 (L) 150 - 400 K/uL    Comment: Immature Platelet Fraction may be clinically indicated, consider ordering this additional test [id number]    nRBC 0.0 0.0 - 0.2 %   Neutrophils Relative % 84 %   Neutro Abs 9.1 (H) 1.7 - 7.7 K/uL   Lymphocytes Relative 3 %   Lymphs Abs 0.3 (L) 0.7 - 4.0 K/uL   Monocytes Relative 11 %   Monocytes Absolute 1.2 (H) 0.1 - 1.0 K/uL   Eosinophils Relative 1 %   Eosinophils Absolute 0.1 0.0 - 0.5 K/uL   Basophils Relative 0 %   Basophils Absolute 0.0 0.0 - 0.1 K/uL   WBC Morphology MORPHOLOGY UNREMARKABLE    RBC Morphology MORPHOLOGY UNREMARKABLE    Smear Review PLATELET COUNT CONFIRMED BY SMEAR    Immature Granulocytes 1 %   Abs Immature Granulocytes 0.07 0.00 - 0.07 K/uL    Comment: Performed at [facility], [address] [place], [place] [address]  SARS Coronavirus 2 (CEPHEID - Performed in [facility] hospital lab), Hosp Order     Status: None   Collection Time: 09/20/18  1:15 PM   Specimen: Nasopharyngeal Swab  Result Value Ref Range   SARS Coronavirus 2 NEGATIVE NEGATIVE    Comment: (NOTE) If result is NEGATIVE SARS-CoV-2 target nucleic acids are NOT DETECTED. The SARS-CoV-2 RNA is generally detectable in upper and lower  respiratory specimens during the acute phase of infection. The lowest  concentration of SARS-CoV-2 viral copies this assay can detect is 250  copies / mL. A negative result does not preclude SARS-CoV-2 infection  and should not be used as the sole basis for treatment or other  patient management decisions.  A negative result may occur with  improper specimen collection / handling, submission of specimen other  than nasopharyngeal swab, presence of viral mutation(s) within the  areas  targeted by this assay, and inadequate number of viral copies  (<250 copies / mL). A negative result must be combined with clinical  observations, patient history, and epidemiological information. If result is POSITIVE SARS-CoV-2 target nucleic acids are DETECTED. The SARS-CoV-2 RNA is  generally detectable in upper and lower  respiratory specimens dur ing the acute phase of infection.  Positive  results are indicative of active infection with SARS-CoV-2.  Clinical  correlation with patient history and other diagnostic information is  necessary to determine patient infection status.  Positive results do  not rule out bacterial infection or co-infection with other viruses. If result is PRESUMPTIVE POSTIVE SARS-CoV-2 nucleic acids MAY BE PRESENT.   A presumptive positive result was obtained on the submitted specimen  and confirmed on repeat testing.  While 2019 novel coronavirus  (SARS-CoV-2) nucleic acids may be present in the submitted sample  additional confirmatory testing may be necessary for epidemiological  and / or clinical management purposes  to differentiate between  SARS-CoV-2 and other Sarbecovirus currently known to infect humans.  If clinically indicated additional testing with an alternate test  methodology [phone] is advised. The SARS-CoV-2 RNA is generally  detectable in upper and lower respiratory sp ecimens during the acute  phase of infection. The expected result is Negative. Fact Sheet for Patients:  BoilerBrush.com.cyhttps://www.fda.gov/media/136312/download Fact Sheet for Healthcare Providers: https://pope.com/https://www.fda.gov/media/136313/download This test is not yet approved or cleared by the [place] FDA and has been authorized for detection and/or diagnosis of SARS-CoV-2 by FDA under an Emergency Use Authorization (EUA).  This EUA will remain in effect (meaning this test can be used) for the duration of the COVID-19 declaration under Section 564(b)(1) of the Act, 21 U.S.C. section  360bbb-3(b)(1), unless the authorization is terminated or revoked sooner. Performed at [facility], [address] [place], [place] [address]   Lipase, blood     Status: None   Collection Time: 09/20/18  1:15 PM  Result Value Ref Range   Lipase 29 11 - 51 U/L    Comment: Performed at [facility], [address] [place], [place] [address]  Urinalysis, Complete w Microscopic     Status: Abnormal   Collection Time: 09/20/18  3:17 PM  Result Value Ref Range   Color, Urine YELLOW (A) YELLOW   APPearance HAZY (A) CLEAR   Specific Gravity, Urine 1.012 1.005 - 1.030   pH 5.0 5.0 - 8.0   Glucose, UA NEGATIVE NEGATIVE mg/dL   Hgb urine dipstick MODERATE (A) NEGATIVE   Bilirubin Urine NEGATIVE NEGATIVE   Ketones, ur 20 (A) NEGATIVE mg/dL   Protein, ur 30 (A) NEGATIVE mg/dL   Nitrite NEGATIVE NEGATIVE   Leukocytes,Ua NEGATIVE NEGATIVE   RBC / HPF 0-5 0 - 5 RBC/hpf   WBC, UA 0-5 0 - 5 WBC/hpf   Bacteria, UA NONE SEEN NONE SEEN   Squamous Epithelial / LPF NONE SEEN 0 - 5    Comment: Performed at [facility], [address] [place], [place] [address]   Ct Abdomen Pelvis Wo Contrast  Result Date: 09/20/2018 CLINICAL DATA:  Abdominal pain. EXAM: CT ABDOMEN AND PELVIS WITHOUT CONTRAST TECHNIQUE: Multidetector CT imaging of the abdomen and pelvis was performed following the standard protocol without IV contrast. COMPARISON:  03/11/2009 FINDINGS: Lower chest: No acute abnormality. Hepatobiliary: Polycystic liver is again identified. There are innumerable fluid density cysts identified throughout both lobes of the liver. The dominant cyst arises from the posterior aspect of the lateral segment of left lobe of liver measuring 7.4 cm. Previously this cyst measured 4.6 cm. Moderate intrahepatic and common bile duct dilatation is again noted as seen on 03/11/2009. The common bile duct measures up to 1.4 cm. No calcified stones identified within the common bile duct.  Pancreas: Unremarkable. No pancreatic ductal dilatation or surrounding inflammatory changes. Spleen: Normal in size without focal abnormality. Adrenals/Urinary Tract: Adrenal glands are difficult to identified. Bilateral polycystic kidneys are again noted. There are innumerable bilateral cystic kidney lesions throughout the enlarged kidneys. Since the previous exam multiple new scattered calcifications are associated with many of the cysts. A number of cysts are complicated by hemorrhage and appear hyperdense. All these cysts are incompletely characterized without IV contrast and underlying cystic neoplasm or solid enhancing neoplasm would be difficult to exclude given lack of IV contrast material. No definite hydronephrosis identified bilaterally. There is been progressive enlargement of the right kidney which measures 13.6 x 7.9 by 11.9 cm (volume = 670 cm^3). Previously 9.1 x 5.2 by 10.0 cm (volume = 250 cm^3) Urinary bladder is unremarkable. Stomach/Bowel: Stomach is within normal limits. Enlarged right kidney has mass effect upon right abdominal bowel loops and displaces the ascending colon anteriorly. No evidence of bowel wall thickening, distention, or inflammatory changes. Numerous distal colonic diverticula identified without acute inflammation. Vascular/Lymphatic: Aortic atherosclerosis without aneurysm. No abdominopelvic adenopathy identified. Reproductive: Status post hysterectomy. No adnexal masses. Other: Trace free fluid identified within the pelvis. Musculoskeletal: No acute or significant osseous findings. IMPRESSION: 1. Innumerable kidney and liver cysts consistent with autosomal dominant polycystic kidney disease. 2. There is progressive right nephromegaly when compared with 03/11/2009. Right kidney has a volume of approximately 670 cc compared with 250 cc previously. The enlarged right kidney has mass effect and displaces the right abdominal bowel loops. 3.  Aortic Atherosclerosis (ICD10-I70.0).  Electronically Signed   By: Kerby Moors M.D.   On: 09/20/2018 15:32   Dg Chest Portable 1 View  Result Date: 09/20/2018 CLINICAL DATA:  Pain after her abdominal hernia. Watery diarrhea with nausea and vomiting past 3 days. Fever. EXAM: PORTABLE CHEST 1 VIEW COMPARISON:  06/16/2010 FINDINGS: Patient slightly rotated to the left. Lungs are adequately inflated without airspace consolidation or effusion. Cardiomediastinal silhouette and remainder of the exam is unchanged. IMPRESSION: No acute findings. Electronically Signed   By: Marin Olp M.D.   On: 09/20/2018 16:49    Pending Labs Unresulted Labs (From admission, onward)    Start     Ordered   09/20/18 1616  Blood culture (routine x 2)  BLOOD CULTURE X 2,   STAT     09/20/18 1615   Signed and Held  Creatinine, serum  (heparin)  Once,   R    Comments: Baseline for heparin therapy IF NOT ALREADY DRAWN.    Signed and Held   Signed and Held  HIV antibody (Routine Testing)  Once,   R     Signed and Held   Signed and Occupational hygienist morning,   R     Signed and Held   Signed and Held  CBC  Tomorrow morning,   R     Signed and Held   Signed and Held  Magnesium  Add-on,   R     Signed and Held          Vitals/Pain Today's Vitals   09/20/18 1521 09/20/18 1530 09/20/18 1600 09/20/18 1630  BP:  138/82 128/81 (!) 142/88  Pulse:  87 95 94  Resp:  20 (!) 26 (!) 24  Temp: (!) 101.2 F (38.4 C)     TempSrc: Oral     SpO2:  96% 96% 98%  Weight:      Height:      PainSc:  Isolation Precautions No active isolations  Medications Medications  ondansetron (ZOFRAN) injection 4 mg (4 mg Intravenous Given 09/20/18 1315)  sodium chloride 0.9 % bolus 1,000 mL (0 mLs Intravenous Stopped 09/20/18 1437)  morphine 4 MG/ML injection 4 mg (4 mg Intravenous Given 09/20/18 1315)  acetaminophen (TYLENOL) tablet 650 mg (650 mg Oral Given 09/20/18 1656)    Mobility walks Low fall risk   Focused Assessments Renal  Assessment Handoff: Pt has polycystic kidney disease worsening and causing some bowel obstruction     R Recommendations: See Admitting Provider Note  Report given to:   Additional Notes:

## 2018-09-20 NOTE — ED Provider Notes (Signed)
[facility] Emergency Department Provider Note  ____________________________________________   First MD Initiated Contact with Patient 09/20/18 1301     (approximate)  I have reviewed the triage vital signs and the nursing notes.   HISTORY  Chief Complaint Abdominal Pain    HPI Jamie Foley is a 63 y.o. female presents emergency department complaining of abdominal pain.  She has had nausea vomiting watery diarrhea for the past 3 days.  Now she has a hard area in the mid abdomen that is very painful.  Some fever and chills.  No chest pain or shortness of breath.  History of multiple hernia repairs.  No UTI symptoms.    Past Medical History:  Diagnosis Date  . Anemia   . Arthritis   . Complication of anesthesia    HARD TO WAKE UP AFTER ANKLE SURGERY-FELT LIKE SHE RECEIVED TO MUCH ANESTHESIA  . Osteoporosis   . Polycystic kidney disease   . Renal insufficiency     There are no active problems to display for this patient.   Past Surgical History:  Procedure Laterality Date  . ABDOMINAL HYSTERECTOMY    . CATARACT EXTRACTION W/PHACO Left 10/14/2014   Procedure: CATARACT EXTRACTION PHACO AND INTRAOCULAR LENS PLACEMENT (IOC);  Surgeon: Estill Cotta, MD;  Location: [facility] ORS;  Service: Ophthalmology;  Laterality: Left;  [place] 01:09 AP% 60.0 CDE 37.21 fluid pack lot #[id number]  . CHOLECYSTECTOMY    . EYE SURGERY Left    DETACHED RETINA   . HERNIA REPAIR    . INGUINAL HERNIA REPAIR Left 08/03/2016   Procedure: HERNIA REPAIR INGUINAL ADULT;  Surgeon: Leonie Green, MD;  Location: [facility] ORS;  Service: General;  Laterality: Left;  . right ankle fracture Right   . VENTRAL HERNIA REPAIR N/A 08/03/2016   Procedure: HERNIA REPAIR VENTRAL ADULT;  Surgeon: Leonie Green, MD;  Location: [facility] ORS;  Service: General;  Laterality: N/A;    Prior to Admission medications   Not on File    Allergies Augmentin [amoxicillin-pot clavulanate],  Levofloxacin, and Prednisone  No family history on file.  Social History Social History   Tobacco Use  . Smoking status: Never Smoker  . Smokeless tobacco: Never Used  Substance Use Topics  . Alcohol use: No  . Drug use: No    Review of Systems  Constitutional: No fever/chills Eyes: No visual changes. ENT: No sore throat. Respiratory: Denies cough Cardiovascular: Denies chest pain Gastrointestinal: Positive for abdominal pain, nausea vomiting watery diarrhea with hard area in the mid abdomen Genitourinary: Negative for dysuria. Musculoskeletal: Negative for back pain. Skin: Negative for rash.    ____________________________________________   PHYSICAL EXAM:  VITAL SIGNS: ED Triage Vitals  Enc Vitals Group     BP 09/20/18 1245 (!) 149/84     Pulse Rate 09/20/18 1242 (!) 107     Resp 09/20/18 1242 17     Temp 09/20/18 1242 98.2 F (36.8 C)     Temp Source 09/20/18 1242 Oral     SpO2 09/20/18 1245 98 %     Weight 09/20/18 1243 104 lb (47.2 kg)     Height 09/20/18 1243 _0  (1.651 m)     Head Circumference --      Peak Flow --      Pain Score 09/20/18 1243 8     Pain Loc --      Pain Edu? --      Excl. in [place]? --     Constitutional: Alert and  oriented. Well appearing and in no acute distress. Eyes: Conjunctivae are normal.  Head: Atraumatic. Nose: No congestion/rhinnorhea. Mouth/Throat: Mucous membranes are moist.   Neck:  supple no lymphadenopathy noted Cardiovascular: Normal rate, regular rhythm. Heart sounds are normal Respiratory: Normal respiratory effort.  No retractions, lungs c t a  Abd: soft tender in mid abdomen, large hard area noted questionable incarcerated hernia versus mass, Bs normal all 4 quad GU: deferred Musculoskeletal: FROM all extremities, warm and well perfused Neurologic:  Normal speech and language.  Skin:  Skin is warm, dry and intact. No rash noted. Psychiatric: Mood and affect are normal. Speech and behavior are normal.   ____________________________________________   LABS (all labs ordered are listed, but only abnormal results are displayed)  Labs Reviewed  COMPREHENSIVE METABOLIC PANEL - Abnormal; Notable for the following components:      Result Value   Sodium 134 (*)    Potassium 3.1 (*)    Chloride 97 (*)    Glucose, Bld 120 (*)    BUN 34 (*)    Creatinine, Ser 1.79 (*)    Calcium 8.3 (*)    Total Bilirubin 1.7 (*)    GFR calc non Af Amer 30 (*)    GFR calc Af Amer 34 (*)    All other components within normal limits  CBC WITH DIFFERENTIAL/PLATELET - Abnormal; Notable for the following components:   WBC 10.7 (*)    Hemoglobin 11.2 (*)    HCT 34.7 (*)    Platelets 88 (*)    Neutro Abs 9.1 (*)    Lymphs Abs 0.3 (*)    Monocytes Absolute 1.2 (*)    All other components within normal limits  URINALYSIS, COMPLETE (UACMP) WITH MICROSCOPIC - Abnormal; Notable for the following components:   Color, Urine YELLOW (*)    APPearance HAZY (*)    Hgb urine dipstick MODERATE (*)    Ketones, ur 20 (*)    Protein, ur 30 (*)    All other components within normal limits  SARS CORONAVIRUS 2 (HOSPITAL ORDER, [place] [facility])  CULTURE, BLOOD (ROUTINE X 2)  CULTURE, BLOOD (ROUTINE X 2)  LIPASE, BLOOD   ____________________________________________   ____________________________________________  RADIOLOGY  CT abdomen/pelvis with IV contrast showed multiple polycystic changes in the liver and kidneys  ____________________________________________   PROCEDURES  Procedure(s) performed: Saline lock, morphine 4 mg IV, Zofran 4 mg IV, normal saline 1 L   Procedures    ____________________________________________   INITIAL IMPRESSION / ASSESSMENT AND PLAN / ED COURSE  Pertinent labs & imaging results that were available during my care of the patient were reviewed by me and considered in my medical decision making (see chart for details).   Patient 63 year old female  presents emergency department complaint of abdominal pain.  History nausea, diarrhea for 3 days.  Hard area noted in the abdomen.  Physical exam patient appears to be quite uncomfortable.  The abdomen is hard and very tender with questionable crepitus noted adjacent to the hard area.  Questionable incarcerated hernia.  DDX: Incarcerated hernia, abdominal mass, intestinal rupture  Labs were CBC, met C, lipase, UA, COVID CT abdomen/pelvis with IV contrast ordered  ----------------------------------------- 4:26 PM on 09/20/2018 -----------------------------------------  CBC has elevated WBC at 10.7, comprehensive metabolic panel has decreased sodium, decreased potassium glucose is elevated, BUN and creatinine are elevated, UA shows 20 ketones, lipase is normal, COVID-19 test is negative, blood cultures are pending  Discussed with Dr. Geryl Councilman.  He is  to admit the patient for [place].  He would like for [place] to order chest x-ray and blood cultures.  Patient was notified that she has been admitted to the hospital.  She states that she does not need pain medication right now.  Tylenol was ordered for fever.  She was being admitted in stable condition.    As part of my medical decision making, I reviewed the following data within the [place] notes reviewed and incorporated, Labs reviewed see above, Old chart reviewed, Radiograph reviewed chest x-ray, CT of the abdomen, Discussed with admitting physician Dr. Bridgett Larsson, Evaluated by EM attending Dr. Cherylann Banas, Notes from prior ED visits and [place] Controlled Substance Database  ____________________________________________   FINAL CLINICAL IMPRESSION(S) / ED DIAGNOSES  Final diagnoses:  Dehydration  AKI (acute kidney injury) ([place])  Nausea vomiting and diarrhea      NEW MEDICATIONS STARTED DURING THIS VISIT:  New Prescriptions   No medications on file     Note:  This document was prepared using Dragon voice recognition software  and may include unintentional dictation errors.    Versie Starks, PA-C 09/20/18 1629    Arta Silence, MD 09/21/18 1332

## 2018-09-20 NOTE — H&P (Addendum)
[facility] [place] [facility]   PATIENT NAME: Jamie RippleRebecca Yapp    MR#:  [id number]  DATE OF BIRTH:  [birth year]  DATE OF ADMISSION:  09/20/2018  PRIMARY CARE PHYSICIAN: Jerl MinaHedrick, James, MD   REQUESTING/REFERRING PHYSICIAN: Faythe GheeFisher, Susan W, PA-C  CHIEF COMPLAINT:   Chief Complaint  Patient presents with   Abdominal Pain   Abdominal pain, nausea and vomiting diarrhea for 3 days. HISTORY OF PRESENT ILLNESS:  Jamie Foley  is a [age] y.o. female with a known history of as below.  The patient has had abdominal pain, nausea, vomiting and diarrhea for the past 3 days.  Abdominal pain is in epigastric area, intermittent, cramp without radiation.  Patient also has poor oral intake and generalized weakness.  She had a fever 102 in the ED.  Urinalysis is unremarkable.  Patient has low potassium and renal failure.  CT of the abdomen report Innumerable kidney and liver cysts consistent with autosomal dominant polycystic kidney disease. 2. There is progressive right nephromegaly when compared with 03/11/2009. Right kidney has a volume of approximately 670 cc compared with 250 cc previously. The enlarged right kidney has mass effect and displaces the right abdominal bowel loops.  ED PA request mission. PAST MEDICAL HISTORY:   Past Medical History:  Diagnosis Date   Anemia    Arthritis    Complication of anesthesia    HARD TO WAKE UP AFTER ANKLE SURGERY-FELT LIKE SHE RECEIVED TO MUCH ANESTHESIA   Osteoporosis    Polycystic kidney disease    Renal insufficiency     PAST SURGICAL HISTORY:   Past Surgical History:  Procedure Laterality Date   ABDOMINAL HYSTERECTOMY     CATARACT EXTRACTION W/PHACO Left 10/14/2014   Procedure: CATARACT EXTRACTION PHACO AND INTRAOCULAR LENS PLACEMENT (IOC);  Surgeon: Sallee LangeSteven Dingeldein, MD;  Location: [facility] ORS;  Service: Ophthalmology;  Laterality: Left;  US 01:09 AP% 60.0 CDE 37.21 fluid pack lot #[id number]   CHOLECYSTECTOMY      EYE SURGERY Left    DETACHED RETINA    HERNIA REPAIR     INGUINAL HERNIA REPAIR Left 08/03/2016   Procedure: HERNIA REPAIR INGUINAL ADULT;  Surgeon: Nadeen LandauSmith, Jarvis Wilton, MD;  Location: [facility] ORS;  Service: General;  Laterality: Left;   right ankle fracture Right    VENTRAL HERNIA REPAIR N/A 08/03/2016   Procedure: HERNIA REPAIR VENTRAL ADULT;  Surgeon: Nadeen LandauSmith, Jarvis Wilton, MD;  Location: [facility] ORS;  Service: General;  Laterality: N/A;    SOCIAL HISTORY:   Social History   Tobacco Use   Smoking status: Never Smoker   Smokeless tobacco: Never Used  Substance Use Topics   Alcohol use: No    FAMILY HISTORY:  No family history on file.  DRUG ALLERGIES:   Allergies  Allergen Reactions   Augmentin [Amoxicillin-Pot Clavulanate] Nausea Only and Other (See Comments)    Stomach pain    Levofloxacin Other (See Comments)   Prednisone Nausea And Vomiting and Other (See Comments)    Doubled over with stomach pain and couldn't sleep    REVIEW OF SYSTEMS:   Review of Systems  Constitutional: Positive for fever and malaise/fatigue. Negative for chills.  HENT: Negative for sore throat.   Eyes: Negative for blurred vision and double vision.  Respiratory: Negative for cough, hemoptysis, shortness of breath, wheezing and stridor.   Cardiovascular: Negative for chest pain, palpitations, orthopnea and leg swelling.  Gastrointestinal: Positive for abdominal pain, diarrhea, nausea and vomiting. Negative for blood in stool and melena.  Genitourinary: Negative for dysuria, flank pain and hematuria.  Musculoskeletal: Negative for back pain and joint pain.  Skin: Negative for rash.  Neurological: Negative for dizziness, sensory change, focal weakness, seizures, loss of consciousness, weakness and headaches.  Endo/Heme/Allergies: Negative for polydipsia.  Psychiatric/Behavioral: Negative for depression. The patient is not nervous/anxious.     MEDICATIONS AT HOME:   Prior to Admission  medications   Not on File      VITAL SIGNS:  Blood pressure 138/82, pulse 87, temperature (!) 101.2 F (38.4 C), temperature source Oral, resp. rate 20, height 5\' 5"  (1.651 m), weight 47.2 kg, SpO2 96 %.  PHYSICAL EXAMINATION:  Physical Exam  GENERAL:  63 y.o.-year-old patient lying in the bed with no acute distress.  EYES: Pupils equal, round, reactive to light and accommodation. No scleral icterus. Extraocular muscles intact.  HEENT: Head atraumatic, normocephalic. Oropharynx and nasopharynx clear.  NECK:  Supple, no jugular venous distention. No thyroid enlargement, no tenderness.  LUNGS: Normal breath sounds bilaterally, no wheezing, rales,rhonchi or crepitation. No use of accessory muscles of respiration.  CARDIOVASCULAR: S1, S2 normal. No murmurs, rubs, or gallops.  ABDOMEN: Soft, tenderness in epigastric area, nondistended. Bowel sounds present. No organomegaly or mass.  EXTREMITIES: No pedal edema, cyanosis, or clubbing.  NEUROLOGIC: Cranial nerves II through XII are intact. Muscle strength 5/5 in all extremities. Sensation intact. Gait not checked.  PSYCHIATRIC: The patient is alert and oriented x 3.  SKIN: No obvious rash, lesion, or ulcer.   LABORATORY PANEL:   CBC Recent Labs  Lab 09/20/18 1315  WBC 10.7*  HGB 11.2*  HCT 34.7*  PLT 88*   ------------------------------------------------------------------------------------------------------------------  Chemistries  Recent Labs  Lab 09/20/18 1315  NA 134*  K 3.1*  CL 97*  CO2 25  GLUCOSE 120*  BUN 34*  CREATININE 1.79*  CALCIUM 8.3*  AST 17  ALT 17  ALKPHOS 57  BILITOT 1.7*   ------------------------------------------------------------------------------------------------------------------  Cardiac Enzymes No results for input(s): TROPONINI in the last 168 hours. ------------------------------------------------------------------------------------------------------------------  RADIOLOGY:  Ct  Abdomen Pelvis Wo Contrast  Result Date: 09/20/2018 CLINICAL DATA:  Abdominal pain. EXAM: CT ABDOMEN AND PELVIS WITHOUT CONTRAST TECHNIQUE: Multidetector CT imaging of the abdomen and pelvis was performed following the standard protocol without IV contrast. COMPARISON:  03/11/2009 FINDINGS: Lower chest: No acute abnormality. Hepatobiliary: Polycystic liver is again identified. There are innumerable fluid density cysts identified throughout both lobes of the liver. The dominant cyst arises from the posterior aspect of the lateral segment of left lobe of liver measuring 7.4 cm. Previously this cyst measured 4.6 cm. Moderate intrahepatic and common bile duct dilatation is again noted as seen on 03/11/2009. The common bile duct measures up to 1.4 cm. No calcified stones identified within the common bile duct. Pancreas: Unremarkable. No pancreatic ductal dilatation or surrounding inflammatory changes. Spleen: Normal in size without focal abnormality. Adrenals/Urinary Tract: Adrenal glands are difficult to identified. Bilateral polycystic kidneys are again noted. There are innumerable bilateral cystic kidney lesions throughout the enlarged kidneys. Since the previous exam multiple new scattered calcifications are associated with many of the cysts. A number of cysts are complicated by hemorrhage and appear hyperdense. All these cysts are incompletely characterized without IV contrast and underlying cystic neoplasm or solid enhancing neoplasm would be difficult to exclude given lack of IV contrast material. No definite hydronephrosis identified bilaterally. There is been progressive enlargement of the right kidney which measures 13.6 x 7.9 by 11.9 cm (volume = 670 cm^3). Previously 9.1 x 5.2  by 10.0 cm (volume = 250 cm^3) Urinary bladder is unremarkable. Stomach/Bowel: Stomach is within normal limits. Enlarged right kidney has mass effect upon right abdominal bowel loops and displaces the ascending colon anteriorly. No  evidence of bowel wall thickening, distention, or inflammatory changes. Numerous distal colonic diverticula identified without acute inflammation. Vascular/Lymphatic: Aortic atherosclerosis without aneurysm. No abdominopelvic adenopathy identified. Reproductive: Status post hysterectomy. No adnexal masses. Other: Trace free fluid identified within the pelvis. Musculoskeletal: No acute or significant osseous findings. IMPRESSION: 1. Innumerable kidney and liver cysts consistent with autosomal dominant polycystic kidney disease. 2. There is progressive right nephromegaly when compared with 03/11/2009. Right kidney has a volume of approximately 670 cc compared with 250 cc previously. The enlarged right kidney has mass effect and displaces the right abdominal bowel loops. 3.  Aortic Atherosclerosis (ICD10-I70.0). Electronically Signed   By: Kerby Moors M.D.   On: 09/20/2018 15:32      IMPRESSION AND PLAN:   Abdominal pain, nausea, vomiting and diarrhea, possible acute gastroenteritis. The patient will be placed for observation. Liquid diet, Zofran as needed, IV fluid support.  Follow-up stool C. difficile test and blood culture.  Acute renal failure due to dehydration, IV fluid support and follow-up BMP. Hypokalemia.  Potassium supplement, follow-up magnesium and potassium level.  Elevated bilirubin.  Possible due to above.  Follow-up level. Polycystic kidney disease.  The patient is following up with [facility].  All the records are reviewed and case discussed with ED provider. Management plans discussed with the patient, family and they are in agreement.  CODE STATUS: Full code.  TOTAL TIME TAKING CARE OF THIS PATIENT: 45 minutes.    Demetrios Loll M.D on 09/20/2018 at 4:46 PM  Between 7am to 6pm - Pager - [phone]  After 6pm go to [email or web address] - Proofreader  [facility]  Office  [phone]  CC: Primary care physician; [place] Pink,  MD   Note: This dictation was prepared with Dragon dictation along with smaller phrase technology. Any transcriptional errors that result from this process are unin

## 2018-09-21 ENCOUNTER — Encounter: Payer: Self-pay | Admitting: *Deleted

## 2018-09-21 LAB — CBC
HCT: 29.7 % — ABNORMAL LOW (ref 36.0–46.0)
Hemoglobin: 9.6 g/dL — ABNORMAL LOW (ref 12.0–15.0)
MCH: 28.2 pg (ref 26.0–34.0)
MCHC: 32.3 g/dL (ref 30.0–36.0)
MCV: 87.4 fL (ref 80.0–100.0)
Platelets: 69 10*3/uL — ABNORMAL LOW (ref 150–400)
RBC: 3.4 MIL/uL — ABNORMAL LOW (ref 3.87–5.11)
RDW: 12.1 % (ref 11.5–15.5)
WBC: 6.4 10*3/uL (ref 4.0–10.5)
nRBC: 0 % (ref 0.0–0.2)

## 2018-09-21 LAB — C DIFFICILE QUICK SCREEN W PCR REFLEX
C Diff antigen: NEGATIVE
C Diff interpretation: NOT DETECTED
C Diff toxin: NEGATIVE

## 2018-09-21 LAB — BASIC METABOLIC PANEL
Anion gap: 10 (ref 5–15)
BUN: 31 mg/dL — ABNORMAL HIGH (ref 8–23)
CO2: 20 mmol/L — ABNORMAL LOW (ref 22–32)
Calcium: 7.6 mg/dL — ABNORMAL LOW (ref 8.9–10.3)
Chloride: 103 mmol/L (ref 98–111)
Creatinine, Ser: 1.66 mg/dL — ABNORMAL HIGH (ref 0.44–1.00)
GFR calc Af Amer: 38 mL/min — ABNORMAL LOW (ref >60)
GFR calc non Af Amer: 32 mL/min — ABNORMAL LOW (ref >60)
Glucose, Bld: 103 mg/dL — ABNORMAL HIGH (ref 70–99)
Potassium: 4.2 mmol/L (ref 3.5–5.1)
Sodium: 133 mmol/L — ABNORMAL LOW (ref 135–145)

## 2018-09-21 MED ORDER — ACETAMINOPHEN 325 MG PO TABS
325.0000 mg | ORAL_TABLET | Freq: Once | ORAL | Status: AC
  Administered 2018-09-21: 325 mg via ORAL
  Filled 2018-09-21: qty 1

## 2018-09-21 MED ORDER — ADULT MULTIVITAMIN W/MINERALS CH
1.0000 | ORAL_TABLET | Freq: Every day | ORAL | Status: DC
  Administered 2018-09-21 – 2018-09-24 (×4): 1 via ORAL
  Filled 2018-09-21 (×4): qty 1

## 2018-09-21 MED ORDER — ENSURE ENLIVE PO LIQD
237.0000 mL | Freq: Two times a day (BID) | ORAL | Status: DC
  Administered 2018-09-21 – 2018-09-23 (×6): 237 mL via ORAL

## 2018-09-21 NOTE — Progress Notes (Addendum)
[facility] [place] [facility] [place] NAME: Kenli Waldo    MR#:  [id number]  DATE OF BIRTH:  [birth year]  SUBJECTIVE:  CHIEF COMPLAINT:   Chief Complaint  Patient presents with  . Abdominal Pain   Nausea and vomiting diarrhea appears to be improved.  Fever with temperature of 100.6 last night.  REVIEW OF SYSTEMS:  Review of Systems  Constitutional: Negative for chills and fever.  HENT: Negative for hearing loss and tinnitus.   Eyes: Negative for blurred vision and double vision.  Respiratory: Negative for cough and sputum production.   Cardiovascular: Negative for chest pain and palpitations.  Gastrointestinal: Positive for abdominal pain, diarrhea, nausea and vomiting.  Genitourinary: Negative for dysuria and urgency.  Musculoskeletal: Negative for myalgias and neck pain.  Skin: Negative for itching and rash.  Neurological: Negative for dizziness and headaches.  Psychiatric/Behavioral: Negative for depression and hallucinations.    DRUG ALLERGIES:   Allergies  Allergen Reactions  . Augmentin [Amoxicillin-Pot Clavulanate] Nausea Only and Other (See Comments)    Stomach pain   . Levofloxacin Other (See Comments)  . Prednisone Nausea And Vomiting and Other (See Comments)    Doubled over with stomach pain and couldn't sleep   VITALS:  Blood pressure 131/86, pulse 97, temperature (!) 100.6 F (38.1 C), temperature source Oral, resp. rate 16, height 5\' 5"  (1.651 m), weight 47.2 kg, SpO2 97 %. PHYSICAL EXAMINATION:   Physical Exam  Constitutional: She is oriented to person, place, and time. She appears well-developed.  HENT:  Head: Normocephalic and atraumatic.  Right Ear: External ear normal.  Eyes: Pupils are equal, round, and reactive to light. Conjunctivae are normal. Right eye exhibits no discharge.  Neck: Normal range of motion. Neck supple. No thyromegaly present.  Cardiovascular: Normal rate, regular rhythm and normal heart sounds.   Respiratory: Effort normal and breath sounds normal. She has no wheezes.  GI: Soft. Bowel sounds are normal. She exhibits no distension. There is no abdominal tenderness.  Musculoskeletal: Normal range of motion.        General: No edema.  Neurological: She is alert and oriented to person, place, and time. No cranial nerve deficit.  Skin: Skin is warm. She is not diaphoretic. No erythema.  Psychiatric: She has a normal mood and affect. Her behavior is normal.   LABORATORY PANEL:  Female CBC Recent Labs  Lab 09/21/18 0249  WBC 6.4  HGB 9.6*  HCT 29.7*  PLT 69*   ------------------------------------------------------------------------------------------------------------------ Chemistries  Recent Labs  Lab 09/20/18 1315 09/21/18 0249  NA 134* 133*  K 3.1* 4.2  CL 97* 103  CO2 25 20*  GLUCOSE 120* 103*  BUN 34* 31*  CREATININE 1.79* 1.66*  CALCIUM 8.3* 7.6*  MG 1.8  --   AST 17  --   ALT 17  --   ALKPHOS 57  --   BILITOT 1.7*  --    RADIOLOGY:  Ct Abdomen Pelvis Wo Contrast  Result Date: 09/20/2018 CLINICAL DATA:  Abdominal pain. EXAM: CT ABDOMEN AND PELVIS WITHOUT CONTRAST TECHNIQUE: Multidetector CT imaging of the abdomen and pelvis was performed following the standard protocol without IV contrast. COMPARISON:  03/11/2009 FINDINGS: Lower chest: No acute abnormality. Hepatobiliary: Polycystic liver is again identified. There are innumerable fluid density cysts identified throughout both lobes of the liver. The dominant cyst arises from the posterior aspect of the lateral segment of left lobe of liver measuring 7.4 cm. Previously this cyst measured 4.6 cm. Moderate intrahepatic  and common bile duct dilatation is again noted as seen on 03/11/2009. The common bile duct measures up to 1.4 cm. No calcified stones identified within the common bile duct. Pancreas: Unremarkable. No pancreatic ductal dilatation or surrounding inflammatory changes. Spleen: Normal in size without focal  abnormality. Adrenals/Urinary Tract: Adrenal glands are difficult to identified. Bilateral polycystic kidneys are again noted. There are innumerable bilateral cystic kidney lesions throughout the enlarged kidneys. Since the previous exam multiple new scattered calcifications are associated with many of the cysts. A number of cysts are complicated by hemorrhage and appear hyperdense. All these cysts are incompletely characterized without IV contrast and underlying cystic neoplasm or solid enhancing neoplasm would be difficult to exclude given lack of IV contrast material. No definite hydronephrosis identified bilaterally. There is been progressive enlargement of the right kidney which measures 13.6 x 7.9 by 11.9 cm (volume = 670 cm^3). Previously 9.1 x 5.2 by 10.0 cm (volume = 250 cm^3) Urinary bladder is unremarkable. Stomach/Bowel: Stomach is within normal limits. Enlarged right kidney has mass effect upon right abdominal bowel loops and displaces the ascending colon anteriorly. No evidence of bowel wall thickening, distention, or inflammatory changes. Numerous distal colonic diverticula identified without acute inflammation. Vascular/Lymphatic: Aortic atherosclerosis without aneurysm. No abdominopelvic adenopathy identified. Reproductive: Status post hysterectomy. No adnexal masses. Other: Trace free fluid identified within the pelvis. Musculoskeletal: No acute or significant osseous findings. IMPRESSION: 1. Innumerable kidney and liver cysts consistent with autosomal dominant polycystic kidney disease. 2. There is progressive right nephromegaly when compared with 03/11/2009. Right kidney has a volume of approximately 670 cc compared with 250 cc previously. The enlarged right kidney has mass effect and displaces the right abdominal bowel loops. 3.  Aortic Atherosclerosis (ICD10-I70.0). Electronically Signed   By: Signa Kellaylor  Stroud M.D.   On: 09/20/2018 15:32   Dg Chest Portable 1 View  Result Date: 09/20/2018  CLINICAL DATA:  Pain after her abdominal hernia. Watery diarrhea with nausea and vomiting past 3 days. Fever. EXAM: PORTABLE CHEST 1 VIEW COMPARISON:  06/16/2010 FINDINGS: Patient slightly rotated to the left. Lungs are adequately inflated without airspace consolidation or effusion. Cardiomediastinal silhouette and remainder of the exam is unchanged. IMPRESSION: No acute findings. Electronically Signed   By: Elberta Fortisaniel  Boyle M.D.   On: 09/20/2018 16:49   ASSESSMENT AND PLAN:   1. Abdominal pain, nausea, vomiting and diarrhea Most likely due to gastroenteritis C. difficile already ordered to be sent once patient has another loose stools.  Diarrhea appear to be resolving.  CT scan of the abdomen and pelvis without contrast revealed findings consistent with autosomal dominant polycystic kidney disease.  Progressive right nephromegaly compared to previously.  [facility] Currently on full liquids.  Will advance as tolerated PRN Zofran.  IV fluid hydration. Patient remains afebrile today.  If found to have C. difficile will initiate p.o. vancomycin  2.  Acute kidney injury  Renal function gradually improving with IV fluid hydration  Follow-up BMP in a.m.  3.Elevated bilirubin of 1.7.  Possible due to above.  Follow-up level. Polycystic kidney disease.  The patient is following up with [facility] physician.  DVT prophylaxis; heparin  All the records are reviewed and case discussed with Care Management/Social Worker. Management plans discussed with the patient, family and they are in agreement.  CODE STATUS: Full Code  TOTAL TIME TAKING CARE OF THIS PATIENT: 36 minutes.   More than 50% of the time was spent in counseling/coordination of care: YES  POSSIBLE D/C IN 2 DAYS, DEPENDING ON CLINICAL CONDITION.   Drena Ham M.D on 09/21/2018 at 11:49 AM  Between 7am to 6pm - Pager - [phone]  After 6pm go to [email or web address] - Social research officer, governmentpassword EPAS [facility]  [facility]  Office  [phone]  CC: Primary care physician; Jerl MinaHedrick, James, MD  Note: This dictation was prepared with Dragon dictation along with smaller phrase technology. Any transcriptional errors that result from this process are unintentional.

## 2018-09-21 NOTE — Progress Notes (Addendum)
Initial Nutrition Assessment  DOCUMENTATION CODES:   Underweight  INTERVENTION:   Ensure Enlive po BID, each supplement provides 350 kcal and 20 grams of protein  MVI daily   NUTRITION DIAGNOSIS:   Inadequate oral intake related to acute illness as evidenced by per patient/family report.  GOAL:   Patient will meet greater than or equal to 90% of their needs  MONITOR:   PO intake, Supplement acceptance, Labs, Weight trends, Skin, I & O's  REASON FOR ASSESSMENT:   Malnutrition Screening Tool    ASSESSMENT:   63 y/o female presents with abdominal pain, nausea and diarrhea for 3 days. Pt found to have gastroenteritis  RD working remotely.  Pt reports poor appetite and oral intake for 3 days pta r/t nausea, vomiting and abdominal pain. Pt currently on full liquid diet. RD will add supplements and MVI to help pt meet his estimated needs. Per chart, pt appears to have lost 12lbs(10%) over the past year; this not significant weight loss.   Pt at high risk for malnutrition but unable to diagnose at this time as NFPE cannot be performed.   Medications reviewed and include: heparin, NaCl w/ KCl @100ml /hr  Labs reviewed: Na 133(L), BUN 31(H), creat 1.66(H) Mg 1.8 wnl- 6/17 Hgb 9.6(L), Hct 29.7(L)  Unable to complete Nutrition-Focused physical exam at this time.   Diet Order:   Diet Order            Diet full liquid Room service appropriate? Yes; Fluid consistency: Thin  Diet effective now             EDUCATION NEEDS:   No education needs have been identified at this time  Skin:  Skin Assessment: Reviewed RN Assessment  Last BM:  6/17- type 6  Height:   Ht Readings from Last 1 Encounters:  09/20/18 5\' 5"  (1.651 m)    Weight:   Wt Readings from Last 1 Encounters:  09/20/18 47.2 kg    Ideal Body Weight:  56.8 kg  BMI:  Body mass index is 17.31 kg/m.  Estimated Nutritional Needs:   Kcal:  1400-1600kcal/day  Protein:  70-80g/day  Fluid:   >1.4L/day  Koleen Distance MS, RD, LDN Pager #- [phone] Office#- [phone] After Hours Pager: [phone]

## 2018-09-22 DIAGNOSIS — M810 Age-related osteoporosis without current pathological fracture: Secondary | ICD-10-CM | POA: Diagnosis present

## 2018-09-22 DIAGNOSIS — R112 Nausea with vomiting, unspecified: Secondary | ICD-10-CM | POA: Diagnosis not present

## 2018-09-22 DIAGNOSIS — N179 Acute kidney failure, unspecified: Secondary | ICD-10-CM | POA: Diagnosis present

## 2018-09-22 DIAGNOSIS — N183 Chronic kidney disease, stage 3 (moderate): Secondary | ICD-10-CM | POA: Diagnosis present

## 2018-09-22 DIAGNOSIS — R197 Diarrhea, unspecified: Secondary | ICD-10-CM

## 2018-09-22 DIAGNOSIS — M1990 Unspecified osteoarthritis, unspecified site: Secondary | ICD-10-CM | POA: Diagnosis present

## 2018-09-22 DIAGNOSIS — E860 Dehydration: Secondary | ICD-10-CM | POA: Diagnosis present

## 2018-09-22 DIAGNOSIS — K7689 Other specified diseases of liver: Secondary | ICD-10-CM | POA: Diagnosis present

## 2018-09-22 DIAGNOSIS — Q612 Polycystic kidney, adult type: Secondary | ICD-10-CM | POA: Diagnosis not present

## 2018-09-22 DIAGNOSIS — I129 Hypertensive chronic kidney disease with stage 1 through stage 4 chronic kidney disease, or unspecified chronic kidney disease: Secondary | ICD-10-CM | POA: Diagnosis present

## 2018-09-22 DIAGNOSIS — K529 Noninfective gastroenteritis and colitis, unspecified: Secondary | ICD-10-CM | POA: Diagnosis not present

## 2018-09-22 DIAGNOSIS — A059 Bacterial foodborne intoxication, unspecified: Secondary | ICD-10-CM | POA: Diagnosis present

## 2018-09-22 DIAGNOSIS — Z888 Allergy status to other drugs, medicaments and biological substances status: Secondary | ICD-10-CM | POA: Diagnosis not present

## 2018-09-22 DIAGNOSIS — Z20828 Contact with and (suspected) exposure to other viral communicable diseases: Secondary | ICD-10-CM | POA: Diagnosis present

## 2018-09-22 DIAGNOSIS — E876 Hypokalemia: Secondary | ICD-10-CM | POA: Diagnosis present

## 2018-09-22 DIAGNOSIS — Z881 Allergy status to other antibiotic agents status: Secondary | ICD-10-CM | POA: Diagnosis not present

## 2018-09-22 DIAGNOSIS — D631 Anemia in chronic kidney disease: Secondary | ICD-10-CM | POA: Diagnosis present

## 2018-09-22 LAB — CBC
HCT: 27.7 % — ABNORMAL LOW (ref 36.0–46.0)
Hemoglobin: 9.1 g/dL — ABNORMAL LOW (ref 12.0–15.0)
MCH: 28.8 pg (ref 26.0–34.0)
MCHC: 32.9 g/dL (ref 30.0–36.0)
MCV: 87.7 fL (ref 80.0–100.0)
Platelets: 67 10*3/uL — ABNORMAL LOW (ref 150–400)
RBC: 3.16 MIL/uL — ABNORMAL LOW (ref 3.87–5.11)
RDW: 12 % (ref 11.5–15.5)
WBC: 4.7 10*3/uL (ref 4.0–10.5)
nRBC: 0 % (ref 0.0–0.2)

## 2018-09-22 LAB — GASTROINTESTINAL PANEL BY PCR, STOOL (REPLACES STOOL CULTURE)

## 2018-09-22 LAB — IRON AND TIBC
Iron: 7 ug/dL — ABNORMAL LOW (ref 28–170)
Saturation Ratios: 4 % — ABNORMAL LOW (ref 10.4–31.8)
TIBC: 173 ug/dL — ABNORMAL LOW (ref 250–450)
UIBC: 166 ug/dL

## 2018-09-22 LAB — BASIC METABOLIC PANEL
Anion gap: 9 (ref 5–15)
BUN: 24 mg/dL — ABNORMAL HIGH (ref 8–23)
CO2: 20 mmol/L — ABNORMAL LOW (ref 22–32)
Calcium: 7.9 mg/dL — ABNORMAL LOW (ref 8.9–10.3)
Chloride: 106 mmol/L (ref 98–111)
Creatinine, Ser: 1.33 mg/dL — ABNORMAL HIGH (ref 0.44–1.00)
GFR calc Af Amer: 49 mL/min — ABNORMAL LOW (ref >60)
GFR calc non Af Amer: 42 mL/min — ABNORMAL LOW (ref >60)
Glucose, Bld: 118 mg/dL — ABNORMAL HIGH (ref 70–99)
Potassium: 4.6 mmol/L (ref 3.5–5.1)
Sodium: 135 mmol/L (ref 135–145)

## 2018-09-22 LAB — C-REACTIVE PROTEIN: CRP: 19.1 mg/dL — ABNORMAL HIGH (ref <1.0)

## 2018-09-22 LAB — FERRITIN: Ferritin: 255 ng/mL (ref 11–307)

## 2018-09-22 LAB — VITAMIN B12: Vitamin B-12: 305 pg/mL (ref 180–914)

## 2018-09-22 LAB — FOLATE: Folate: 46 ng/mL (ref >5.9)

## 2018-09-22 LAB — MAGNESIUM: Magnesium: 1.7 mg/dL (ref 1.7–2.4)

## 2018-09-22 LAB — HIV ANTIBODY (ROUTINE TESTING W REFLEX): HIV Screen 4th Generation wRfx: NONREACTIVE

## 2018-09-22 MED ORDER — PIPERACILLIN-TAZOBACTAM 3.375 G IVPB
3.3750 g | Freq: Three times a day (TID) | INTRAVENOUS | Status: DC
  Administered 2018-09-22 – 2018-09-23 (×3): 3.375 g via INTRAVENOUS
  Filled 2018-09-22 (×3): qty 50

## 2018-09-22 MED ORDER — LOPERAMIDE HCL 2 MG PO CAPS
2.0000 mg | ORAL_CAPSULE | ORAL | Status: DC | PRN
  Administered 2018-09-22 – 2018-09-24 (×2): 2 mg via ORAL
  Filled 2018-09-22 (×3): qty 1

## 2018-09-22 NOTE — Progress Notes (Addendum)
[facility] [place] [facility]   PATIENT NAME: Jamie RippleRebecca Foley    MR#:  [id number]  DATE OF BIRTH:  1955-11-30  SUBJECTIVE:  CHIEF COMPLAINT:   Chief Complaint  Patient presents with  . Abdominal Pain    Patient still having fevers.  Recent fever with temperature of 101.5.  Still having diarrhea.  Initiated antibiotics and GI consulted   REVIEW OF SYSTEMS:  Review of Systems  Constitutional: Negative for chills and fever.  HENT: Negative for hearing loss and tinnitus.   Eyes: Negative for blurred vision and double vision.  Respiratory: Negative for cough and sputum production.   Cardiovascular: Negative for chest pain and palpitations.  Gastrointestinal: Positive for abdominal pain, diarrhea, nausea and vomiting.  Genitourinary: Negative for dysuria and urgency.  Musculoskeletal: Negative for myalgias and neck pain.  Skin: Negative for itching and rash.  Neurological: Negative for dizziness and headaches.  Psychiatric/Behavioral: Negative for depression and hallucinations.    DRUG ALLERGIES:   Allergies  Allergen Reactions  . Augmentin [Amoxicillin-Pot Clavulanate] Nausea Only and Other (See Comments)    Stomach pain   . Levofloxacin Other (See Comments)  . Prednisone Nausea And Vomiting and Other (See Comments)    Doubled over with stomach pain and couldn't sleep   VITALS:  Blood pressure (!) 145/83, pulse 89, temperature (!) 101.5 F (38.6 C), temperature source Oral, resp. rate 16, height 5\' 5"  (1.651 m), weight 47.2 kg, SpO2 97 %. PHYSICAL EXAMINATION:   Physical Exam  Constitutional: She is oriented to person, place, and time. She appears well-developed.  HENT:  Head: Normocephalic and atraumatic.  Right Ear: External ear normal.  Eyes: Pupils are equal, round, and reactive to light. Conjunctivae are normal. Right eye exhibits no discharge.  Neck: Normal range of motion. Neck supple. No thyromegaly present.  Cardiovascular: Normal  rate, regular rhythm and normal heart sounds.  Respiratory: Effort normal and breath sounds normal. She has no wheezes.  GI: Soft. Bowel sounds are normal. She exhibits no distension. There is no abdominal tenderness.  Musculoskeletal: Normal range of motion.        General: No edema.  Neurological: She is alert and oriented to person, place, and time. No cranial nerve deficit.  Skin: Skin is warm. She is not diaphoretic. No erythema.  Psychiatric: She has a normal mood and affect. Her behavior is normal.   LABORATORY PANEL:  Female CBC Recent Labs  Lab 09/22/18 0427  WBC 4.7  HGB 9.1*  HCT 27.7*  PLT 67*   ------------------------------------------------------------------------------------------------------------------ Chemistries  Recent Labs  Lab 09/20/18 1315  09/22/18 0427  NA 134*   < > 135  K 3.1*   < > 4.6  CL 97*   < > 106  CO2 25   < > 20*  GLUCOSE 120*   < > 118*  BUN 34*   < > 24*  CREATININE 1.79*   < > 1.33*  CALCIUM 8.3*   < > 7.9*  MG 1.8  --  1.7  AST 17  --   --   ALT 17  --   --   ALKPHOS 57  --   --   BILITOT 1.7*  --   --    < > = values in this interval not displayed.   RADIOLOGY:  No results found. ASSESSMENT AND PLAN:   1. Abdominal pain, nausea, vomiting and diarrhea Most likely due to gastroenteritis. C. difficile negative.  Patient still having significant diarrhea.  Still having fevers with temperature of greater than 101.  GI panel negative. Initiated IV antibiotics with Zosyn. Consulted gastroenterologist.  PRN Imodium. Had recent CT scan of the abdomen and pelvis without contrast revealed findings consistent with autosomal dominant polycystic kidney disease.  Progressive right nephromegaly compared to previously.   Initial plan was to follow-up with her physicians at [facility] post discharge I was later notified that the patient wanted a Urology consult. I placed a consult for Urologist on call; Dr Jeffie Pollock. I discussed case with him and he  recommended patient follow up with her Urologist at [facility] and does not believe this is an infected cyst based on the CT report. If patient continues to have fevers in am, please consider discussing case with her Urologist at [facility].  Currently on full liquids.  Will advance as tolerated PRN Zofran.  IV fluid hydration.  2.  Acute kidney injury  Renal function gradually improving with IV fluid hydration  Follow-up BMP in a.m.  3.Elevated bilirubin of 1.7.  Possible due to above.  Follow-up level. Polycystic kidney disease.  The patient is following up with [facility].  4.  Dehydration Secondary to ongoing diarrhea.  Patient requiring IV fluids and IV antibiotics for management.  Changed from observation to inpatient.  DVT prophylaxis; heparin  All the records are reviewed and case discussed with Care Management/Social Worker. Management plans discussed with the patient, family and they are in agreement.  CODE STATUS: Full Code  TOTAL TIME TAKING CARE OF THIS PATIENT: 35 minutes.   More than 50% of the time was spent in counseling/coordination of care: YES  POSSIBLE D/C IN 2 DAYS, DEPENDING ON CLINICAL CONDITION.   Dillinger Aston M.D on 09/22/2018 at 2:00 PM  Between 7am to 6pm - Pager - [phone]  After 6pm go to [email or web address] - Proofreader  [facility]  Office  [phone]  CC: Primary care physician; [place] Pink, MD  Note: This dictation was prepared with Dragon dictation along with smaller phrase technology. Any transcriptional errors that result from this process are unintentional.

## 2018-09-22 NOTE — Consult Note (Signed)
Pharmacy Antibiotic Note  Jamie Foley is a 63 y.o. female admitted on 09/20/2018 with colitis.  Pharmacy has been consulted for Zosyn dosing. She is having fevers with diarrhea and is C diff negative. Her SCr was elevated above baseline on admission but has resolved.  Plan: Zosyn 3.375g IV q8h (4 hour infusion).  Height: 5\' 5"  (165.1 cm) Weight: 104 lb (47.2 kg) IBW/kg (Calculated) : 57  Temp (24hrs), Avg:99.7 F (37.6 C), Min:97.9 F (36.6 C), Max:102.9 F (39.4 C)  Recent Labs  Lab 09/20/18 1315 09/21/18 0249 09/22/18 0427  WBC 10.7* 6.4 4.7  CREATININE 1.79* 1.66* 1.33*    Estimated Creatinine Clearance: 32.3 mL/min (A) (by C-G formula based on SCr of 1.33 mg/dL (H)).    Antimicrobials this admission: Zosyn 6/19 >>   Microbiology results: 6/17 BCx: NGTD 6/17 SARS CoV-2: negative  6/18 C diff: negative  Thank you for allowing pharmacy to be a part of this patient's care.  Dallie Piles, PharmD 09/22/2018 8:54 AM

## 2018-09-22 NOTE — Consult Note (Addendum)
Jamie Darby, MD [address]  [place]  [place], [place] [address]  Main: [phone]  Fax: [phone] Pager: [phone]   Consultation  Referring Provider:     No ref. provider found Primary Care Physician:  Jamie Pink, MD Primary Gastroenterologist: None     Reason for Consultation:     Acute gastroenteritis  Date of Admission:  09/20/2018 Date of Consultation:  09/22/2018         HPI:   Jamie Foley is a 63 y.o. 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  Patient reports that she had her dog on Sunday night, followed by that she started having symptoms of nausea, vomiting and diarrhea in few hours. She is found to have hypokalemia, AKI, anemia, mild leukocytosis as well as thrombocytopenia.  She had CT abdomen and pelvis without contrast which did not reveal inflammation in her GI tract.  C. difficile came back negative, GI pathogen panel came back negative.  She is empirically started on Zosyn.  Patient has history of thrombocytopenia dating back to 06/2010 as well as mild normocytic anemia at that time.  Her AKI is improving with IV fluids, nausea and vomiting have resolved, she received Imodium today and no bowel movements so far.  She does feel gassy and distended.  GI is consulted for further evaluation, her hemoglobin on admission was 11.2, dropped to 9.1, platelets 67, LFTs normal She reports otherwise healthy, physically active and denies having any GI symptoms at baseline  NSAIDs: None  Antiplts/Anticoagulants/Anti thrombotics: None  GI Procedures: Reports having had a colonoscopy about 1 to 2 years ago and normal  Past Medical History:  Diagnosis Date   Anemia    Arthritis    Complication of anesthesia    HARD TO WAKE UP AFTER ANKLE SURGERY-FELT LIKE SHE RECEIVED TO MUCH ANESTHESIA   Osteoporosis    Polycystic kidney  disease    Renal insufficiency     Past Surgical History:  Procedure Laterality Date   ABDOMINAL HYSTERECTOMY     CATARACT EXTRACTION W/PHACO Left 10/14/2014   Procedure: CATARACT EXTRACTION PHACO AND INTRAOCULAR LENS PLACEMENT ([place]);  Surgeon: Estill Cotta, MD;  Location: [facility] ORS;  Service: Ophthalmology;  Laterality: Left;  [place] 01:09 AP% 60.0 CDE 37.21 fluid pack lot #[id number]   CHOLECYSTECTOMY     EYE SURGERY Left    DETACHED RETINA    HERNIA REPAIR     INGUINAL HERNIA REPAIR Left 08/03/2016   Procedure: HERNIA REPAIR INGUINAL ADULT;  Surgeon: Leonie Green, MD;  Location: [facility] ORS;  Service: General;  Laterality: Left;   right ankle fracture Right    VENTRAL HERNIA REPAIR N/A 08/03/2016   Procedure: HERNIA REPAIR VENTRAL ADULT;  Surgeon: Leonie Green, MD;  Location: [facility] ORS;  Service: General;  Laterality: N/A;    Current Facility-Administered Medications:    0.9 % NaCl with KCl 40 mEq / L  infusion, , Intravenous, Continuous, Jamie Loll, MD, Last Rate: 100 mL/hr at 09/22/18 1518   acetaminophen (TYLENOL) tablet 650 mg, 650 mg, Oral, Q6H PRN, 650 mg at 09/22/18 1243 **OR** acetaminophen (TYLENOL) suppository 650 mg, 650 mg, Rectal, Q6H PRN, Jamie Loll, MD   albuterol (PROVENTIL) (2.5 MG/3ML) 0.083% nebulizer solution 2.5 mg, 2.5 mg, Nebulization, Q2H PRN, Jamie Loll, MD   feeding supplement (ENSURE ENLIVE) (ENSURE ENLIVE) liquid 237 mL, 237 mL, Oral, BID BM, Foley,  Jude, MD, 237 mL at 09/22/18 1249   heparin injection 5,000 Units, 5,000 Units, Subcutaneous, Q8H, Jamie Loll, MD, 5,000 Units at 09/22/18 1249   HYDROcodone-acetaminophen (NORCO/VICODIN) 5-325 MG per tablet 1-2 tablet, 1-2 tablet, Oral, Q4H PRN, Jamie Loll, MD   loperamide (IMODIUM) capsule 2 mg, 2 mg, Oral, PRN, Stark Jock, Jude, MD, 2 mg at 09/22/18 1358   multivitamin with minerals tablet 1 tablet, 1 tablet, Oral, Daily, Foley, Jude, MD, 1 tablet at 09/22/18 0931   ondansetron (ZOFRAN)  tablet 4 mg, 4 mg, Oral, Q6H PRN **OR** ondansetron (ZOFRAN) injection 4 mg, 4 mg, Intravenous, Q6H PRN, Jamie Loll, MD   piperacillin-tazobactam (ZOSYN) IVPB 3.375 g, 3.375 g, Intravenous, Q8H, Dallie Piles, RPH, Last Rate: 12.5 mL/hr at 09/22/18 1711, 3.375 g at 09/22/18 1711  Prior to Admission medications   Not on File    No family history on file.   Social History   Tobacco Use   Smoking status: Never Smoker   Smokeless tobacco: Never Used  Substance Use Topics   Alcohol use: No   Drug use: No    Allergies as of 09/20/2018 - Review Complete 09/20/2018  Allergen Reaction Noted   Augmentin [amoxicillin-pot clavulanate] Nausea Only and Other (See Comments) 10/11/2014   Levofloxacin Other (See Comments) 05/24/2016   Prednisone Nausea And Vomiting and Other (See Comments) 07/21/2016    Review of Systems:    All systems reviewed and negative except where noted in HPI.   Physical Exam:  Vital signs in last 24 hours: Temp:  [97.9 F (36.6 C)-102.9 F (39.4 C)] 101.5 F (38.6 C) (06/19 1228) Pulse Rate:  [89-100] 89 (06/19 1228) Resp:  [16-20] 16 (06/19 1228) BP: (131-145)/(74-93) 145/83 (06/19 1228) SpO2:  [95 %-98 %] 97 % (06/19 1228) Last BM Date: 09/22/18 General:   Pleasant, cooperative in NAD Head:  Normocephalic and atraumatic. Eyes:   No icterus.   Conjunctiva Foley. PERRLA. Ears:  Normal auditory acuity. Neck:  Supple; no masses or thyroidomegaly Lungs: Respirations even and unlabored. Lungs clear to auscultation bilaterally.   No wheezes, crackles, or rhonchi.  Heart:  Regular rate and rhythm;  Without murmur, clicks, rubs or gallops Abdomen:  Soft, distended, palpable enlarged kidneys, nontender. Normal bowel sounds. No appreciable masses or hepatomegaly.  No rebound or guarding.  Rectal:  Not performed. Msk:  Symmetrical without gross deformities.  Strength generalized weakness Extremities:  Without edema, cyanosis or clubbing. Neurologic:  Alert  and oriented x3;  grossly normal neurologically. Skin:  Intact without significant lesions or rashes. Psych:  Alert and cooperative. Normal affect.  LAB RESULTS: CBC Latest Ref Rng & Units 09/22/2018 09/21/2018 09/20/2018  WBC 4.0 - 10.5 K/uL 4.7 6.4 10.7(H)  Hemoglobin 12.0 - 15.0 g/dL 9.1(L) 9.6(L) 11.2(L)  Hematocrit 36.0 - 46.0 % 27.7(L) 29.7(L) 34.7(L)  Platelets 150 - 400 K/uL 67(L) 69(L) 88(L)    BMET BMP Latest Ref Rng & Units 09/22/2018 09/21/2018 09/20/2018  Glucose 70 - 99 mg/dL 118(H) 103(H) 120(H)  BUN 8 - 23 mg/dL 24(H) 31(H) 34(H)  Creatinine 0.44 - 1.00 mg/dL 1.33(H) 1.66(H) 1.79(H)  Sodium 135 - 145 mmol/L 135 133(L) 134(L)  Potassium 3.5 - 5.1 mmol/L 4.6 4.2 3.1(L)  Chloride 98 - 111 mmol/L 106 103 97(L)  CO2 22 - 32 mmol/L 20(L) 20(L) 25  Calcium 8.9 - 10.3 mg/dL 7.9(L) 7.6(L) 8.3(L)    LFT Hepatic Function Latest Ref Rng & Units 09/20/2018  Total Protein 6.5 - 8.1 g/dL 7.4  Albumin 3.5 -  5.0 g/dL 3.5  AST 15 - 41 U/L 17  ALT 0 - 44 U/L 17  Alk Phosphatase 38 - 126 U/L 57  Total Bilirubin 0.3 - 1.2 mg/dL 1.7(H)     STUDIES: Ct Abdomen Pelvis Wo Contrast  Result Date: 09/20/2018 CLINICAL DATA:  Abdominal pain. EXAM: CT ABDOMEN AND PELVIS WITHOUT CONTRAST TECHNIQUE: Multidetector CT imaging of the abdomen and pelvis was performed following the standard protocol without IV contrast. COMPARISON:  03/11/2009 FINDINGS: Lower chest: No acute abnormality. Hepatobiliary: Polycystic liver is again identified. There are innumerable fluid density cysts identified throughout both lobes of the liver. The dominant cyst arises from the posterior aspect of the lateral segment of left lobe of liver measuring 7.4 cm. Previously this cyst measured 4.6 cm. Moderate intrahepatic and common bile duct dilatation is again noted as seen on 03/11/2009. The common bile duct measures up to 1.4 cm. No calcified stones identified within the common bile duct. Pancreas: Unremarkable. No pancreatic  ductal dilatation or surrounding inflammatory changes. Spleen: Normal in size without focal abnormality. Adrenals/Urinary Tract: Adrenal glands are difficult to identified. Bilateral polycystic kidneys are again noted. There are innumerable bilateral cystic kidney lesions throughout the enlarged kidneys. Since the previous exam multiple new scattered calcifications are associated with many of the cysts. A number of cysts are complicated by hemorrhage and appear hyperdense. All these cysts are incompletely characterized without IV contrast and underlying cystic neoplasm or solid enhancing neoplasm would be difficult to exclude given lack of IV contrast material. No definite hydronephrosis identified bilaterally. There is been progressive enlargement of the right kidney which measures 13.6 x 7.9 by 11.9 cm (volume = 670 cm^3). Previously 9.1 x 5.2 by 10.0 cm (volume = 250 cm^3) Urinary bladder is unremarkable. Stomach/Bowel: Stomach is within normal limits. Enlarged right kidney has mass effect upon right abdominal bowel loops and displaces the ascending colon anteriorly. No evidence of bowel wall thickening, distention, or inflammatory changes. Numerous distal colonic diverticula identified without acute inflammation. Vascular/Lymphatic: Aortic atherosclerosis without aneurysm. No abdominopelvic adenopathy identified. Reproductive: Status post hysterectomy. No adnexal masses. Other: Trace free fluid identified within the pelvis. Musculoskeletal: No acute or significant osseous findings. IMPRESSION: 1. Innumerable kidney and liver cysts consistent with autosomal dominant polycystic kidney disease. 2. There is progressive right nephromegaly when compared with 03/11/2009. Right kidney has a volume of approximately 670 cc compared with 250 cc previously. The enlarged right kidney has mass effect and displaces the right abdominal bowel loops. 3.  Aortic Atherosclerosis (ICD10-I70.0). Electronically Signed   By: Kerby Moors M.D.   On: 09/20/2018 15:32   Dg Chest Portable 1 View  Result Date: 09/20/2018 CLINICAL DATA:  Pain after her abdominal hernia. Watery diarrhea with nausea and vomiting past 3 days. Fever. EXAM: PORTABLE CHEST 1 VIEW COMPARISON:  06/16/2010 FINDINGS: Patient slightly rotated to the left. Lungs are adequately inflated without airspace consolidation or effusion. Cardiomediastinal silhouette and remainder of the exam is unchanged. IMPRESSION: No acute findings. Electronically Signed   By: Marin Olp M.D.   On: 09/20/2018 16:49      Impression / Plan:   Jamie Foley is a 63 y.o. pleasant Caucasian female with history of autosomal dominant polycystic kidney and liver admitted with acute gastroenteritis after eating hot dog.  Her symptoms are highly suggestive of food poisoning.  GI pathogen panel and C. difficile came back negative.  Her symptoms have almost resolved today.  AKI is improving to IV fluids.  Do not recommend endoscopic  evaluation at this time.  Antibiotics can be discontinued.  She does have anemia and thrombocytopenia.  She does not have chronic liver disease or splenomegaly.  Recommend referral to hematology as outpatient for further evaluation.  Patient is requesting GI follow-up in 3 to 4 weeks after discharge and I can see her for anemia  Advance diet and anticipate discharge home tomorrow  Thank you for involving me in the care of this patient.  GI will sign off at this time, please call us back with questions or concerns    LOS: 0 days   Sherri Sear, MD  09/22/2018, 2:02 PM   Note: This dictation was prepared with Dragon dictation along with smaller phrase technology. Any transcriptional errors that result from this process are unintentional.

## 2018-09-23 ENCOUNTER — Inpatient Hospital Stay: Payer: BC Managed Care – PPO

## 2018-09-23 LAB — CBC
HCT: 29.1 % — ABNORMAL LOW (ref 36.0–46.0)
Hemoglobin: 9.6 g/dL — ABNORMAL LOW (ref 12.0–15.0)
MCH: 28.2 pg (ref 26.0–34.0)
MCHC: 33 g/dL (ref 30.0–36.0)
MCV: 85.6 fL (ref 80.0–100.0)
Platelets: 92 10*3/uL — ABNORMAL LOW (ref 150–400)
RBC: 3.4 MIL/uL — ABNORMAL LOW (ref 3.87–5.11)
RDW: 12.2 % (ref 11.5–15.5)
WBC: 6.7 10*3/uL (ref 4.0–10.5)
nRBC: 0 % (ref 0.0–0.2)

## 2018-09-23 LAB — BASIC METABOLIC PANEL
Anion gap: 7 (ref 5–15)
BUN: 18 mg/dL (ref 8–23)
CO2: 23 mmol/L (ref 22–32)
Calcium: 8.3 mg/dL — ABNORMAL LOW (ref 8.9–10.3)
Chloride: 103 mmol/L (ref 98–111)
Creatinine, Ser: 1.42 mg/dL — ABNORMAL HIGH (ref 0.44–1.00)
GFR calc Af Amer: 45 mL/min — ABNORMAL LOW (ref >60)
GFR calc non Af Amer: 39 mL/min — ABNORMAL LOW (ref >60)
Glucose, Bld: 102 mg/dL — ABNORMAL HIGH (ref 70–99)
Potassium: 4.3 mmol/L (ref 3.5–5.1)
Sodium: 133 mmol/L — ABNORMAL LOW (ref 135–145)

## 2018-09-23 LAB — PHOSPHORUS: Phosphorus: 1.4 mg/dL — ABNORMAL LOW (ref 2.5–4.6)

## 2018-09-23 LAB — MAGNESIUM: Magnesium: 1.9 mg/dL (ref 1.7–2.4)

## 2018-09-23 MED ORDER — GUAIFENESIN 100 MG/5ML PO SOLN
5.0000 mL | ORAL | Status: DC | PRN
  Administered 2018-09-23: 100 mg via ORAL
  Filled 2018-09-23 (×2): qty 5

## 2018-09-23 NOTE — Progress Notes (Addendum)
[facility] - [place] at [facility]   PATIENT NAME: Jamie RippleRebecca Hu    MR#:  [id number]  DATE OF BIRTH:  January 11, 1956  SUBJECTIVE:   Patient states she is feeling much better this morning.  She has not had any more episodes of diarrhea.  She denies any vomiting.  She did have a fever to 101.35F yesterday.  Her abdominal pain has improved.  REVIEW OF SYSTEMS:  Review of Systems  Constitutional: Negative for chills and fever.  HENT: Negative for hearing loss and tinnitus.   Eyes: Negative for blurred vision and double vision.  Respiratory: Negative for cough and sputum production.   Cardiovascular: Negative for chest pain and palpitations.  Gastrointestinal: Negative for abdominal pain, diarrhea, nausea and vomiting.  Genitourinary: Negative for dysuria and urgency.  Musculoskeletal: Negative for myalgias and neck pain.  Skin: Negative for itching and rash.  Neurological: Negative for dizziness and headaches.  Psychiatric/Behavioral: Negative for depression and hallucinations.    DRUG ALLERGIES:   Allergies  Allergen Reactions  . Augmentin [Amoxicillin-Pot Clavulanate] Nausea Only and Other (See Comments)    Stomach pain   . Levofloxacin Other (See Comments)  . Prednisone Nausea And Vomiting and Other (See Comments)    Doubled over with stomach pain and couldn't sleep   VITALS:  Blood pressure (!) 124/98, pulse 93, temperature 98.6 F (37 C), temperature source Oral, resp. rate 16, height 5\' 5"  (1.651 m), weight 47.2 kg, SpO2 98 %. PHYSICAL EXAMINATION:   Physical Exam  Constitutional: She is oriented to person, place, and time. She appears well-developed.  HENT:  Head: Normocephalic and atraumatic.  Right Ear: External ear normal.  Eyes: Pupils are equal, round, and reactive to light. Conjunctivae are normal. Right eye exhibits no discharge.  Neck: Normal range of motion. Neck supple. No thyromegaly present.  Cardiovascular: Normal rate, regular rhythm  and normal heart sounds.  Respiratory: Effort normal and breath sounds normal. She has no wheezes.  GI: Soft. Bowel sounds are normal. She exhibits no distension. There is no rebound and no guarding.  + Mild diffuse tenderness to palpation  Musculoskeletal: Normal range of motion.        General: No edema.  Neurological: She is alert and oriented to person, place, and time. No cranial nerve deficit.  Skin: Skin is warm. She is not diaphoretic. No erythema.  Psychiatric: She has a normal mood and affect. Her behavior is normal.   LABORATORY PANEL:  Female CBC Recent Labs  Lab 09/23/18 0720  WBC 6.7  HGB 9.6*  HCT 29.1*  PLT 92*   ------------------------------------------------------------------------------------------------------------------ Chemistries  Recent Labs  Lab 09/20/18 1315  09/23/18 0720  NA 134*   < > 133*  K 3.1*   < > 4.3  CL 97*   < > 103  CO2 25   < > 23  GLUCOSE 120*   < > 102*  BUN 34*   < > 18  CREATININE 1.79*   < > 1.42*  CALCIUM 8.3*   < > 8.3*  MG 1.8   < > 1.9  AST 17  --   --   ALT 17  --   --   ALKPHOS 57  --   --   BILITOT 1.7*  --   --    < > = values in this interval not displayed.   RADIOLOGY:  Dg Chest 1 View  Result Date: 09/23/2018 CLINICAL DATA:  Admitted recently for food poisoning.  Cough. EXAM: CHEST  1 VIEW COMPARISON:  09/20/2018 FINDINGS: Lungs are adequately inflated and otherwise clear. Cardiomediastinal silhouette and remainder of the exam is unchanged. IMPRESSION: No active disease. Electronically Signed   By: Marin Olp M.D.   On: 09/23/2018 11:04   ASSESSMENT AND PLAN:   Nausea/vomiting/diarrhea- likely due to gastroenteritis -CT abdomen/pelvis with ADPCKD, but no acute abnormalities -C. difficile and GI pathogen panel negative -Seen by GI, who recommended stopping IV antibiotics -Case discussed with urology, who did not feel that patient had an infected cyst -Advance to regular diet today -Continue IV fluids   Acute kidney injury-likely due to dehydration. Creatinine improved compared to admission -Continue IV fluids  Elevated bilirubin of 1.7.  Possible due to above. -Check LDH and haptoglobin, given patient's drop in hemoglobin -Recheck bilirubin in the morning  Autosomal dominant polycystic kidney disease- patient has been seen for this in the past at [facility] to follow-up with [facility]rology on discharge  Normocytic anemia- likely due to chronic kidney disease -Anemia panel unremarkable -Recheck CBC as an outpatient  Generalized weakness-likely due to above -PT eval pending  DVT prophylaxis- heparin  All the records are reviewed and case discussed with Care Management/Social Worker. Management plans discussed with the patient, family and they are in agreement.  CODE STATUS: Full Code  TOTAL TIME TAKING CARE OF THIS PATIENT: 35 minutes.   More than 50% of the time was spent in counseling/coordination of care: YES  POSSIBLE D/C IN 1-2 DAYS, DEPENDING ON CLINICAL CONDITION.   Berna Spare Abdoul Encinas M.D on 09/23/2018 at 2:07 PM  Between 7am to 6pm - Pager - [phone]  After 6pm go to [email or web address] - Proofreader  [facility]  Office  [phone]  CC: Primary care physician; [place] Pink, MD  Note: This dictation was prepared with Dragon dictation along with smaller phrase technology. Any transcriptional errors that result from this process are unintentional.

## 2018-09-23 NOTE — Evaluation (Signed)
Physical Therapy Evaluation Patient Details Name: Jamie Foley MRN: [id number] DOB: 1955/05/31 Today's Date: 09/23/2018   History of Present Illness  From MD H&P 09/20/18: Pt is a 63 y.o. female with a known history that includes: anemia, osteoporosis, polycystic kidney disease, renal insufficiency, and hernia repair.  The patient has had abdominal pain, nausea, vomiting and diarrhea for the past 3 days.  Abdominal pain is in epigastric area, intermittent, cramp without radiation.  Patient also has poor oral intake and generalized weakness.  She had a fever 102 in the ED.  Urinalysis is unremarkable.  Patient has low potassium and renal failure.  CT of the abdomen report Innumerable kidney and liver cysts consistent with autosomal dominant polycystic kidney disease. 2. There is progressive right nephromegaly when compared with 03/11/2009. Right kidney has a volume of approximately 670 cc compared with 250 cc previously. The enlarged right kidney has mass effect and displaces the right abdominal bowel loops.  Assessment includes: Nausea/vomiting/diarrhea likely due to gastroenteritis, acute kidney injury, Autosomal dominant polycystic kidney disease, weakness.    Clinical Impression  Pt presents with deficits in strength, transfers, mobility, gait, and activity tolerance but overall performed well during the session.  Pt was Ind with bed mobility tasks and SBA with transfers and amb.  Pt self-limited amb due to abdominal pain but was able to amb a short distance in the room without an AD and with good stability.  Will keep pt on PT caseload while in acute care to prevent functional decline but pt reports no desire for PT services at home upon discharge with this PT in agreement that HHPT should not be necessary.         Follow Up Recommendations No PT follow up    Equipment Recommendations  None recommended by PT    Recommendations for Other Services       Precautions / Restrictions  Precautions Precautions: None Restrictions Weight Bearing Restrictions: No      Mobility  Bed Mobility Overal bed mobility: Independent                Transfers Overall transfer level: Needs assistance Equipment used: None Transfers: Sit to/from Stand Sit to Stand: Supervision         General transfer comment: Good eccentric and concentric control during transfers  Ambulation/Gait Ambulation/Gait assistance: Supervision Gait Distance (Feet): 10 Feet Assistive device: None Gait Pattern/deviations: Step-through pattern;Decreased step length - right;Decreased step length - left Gait velocity: decreased   General Gait Details: Pt requested short amb in room only this session secondary to abdominal pain but was steady with amb without an AD.  Stairs            Wheelchair Mobility    Modified Rankin (Stroke Patients Only)       Balance Overall balance assessment: No apparent balance deficits (not formally assessed)                                           Pertinent Vitals/Pain Pain Assessment: 0-10 Pain Score: 3  Pain Location: R abdominal pain Pain Descriptors / Indicators: Aching Pain Intervention(s): Monitored during session;Limited activity within patient's tolerance    Home Living Family/patient expects to be discharged to:: Private residence Living Arrangements: Parent Available Help at Discharge: Family;Available PRN/intermittently;Other (Comment)(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     Hand Dominance        Extremity/Trunk Assessment   Upper Extremity Assessment Upper Extremity Assessment: Generalized weakness    Lower Extremity Assessment Lower Extremity Assessment: Generalized weakness       Communication   Communication: No difficulties  Cognition Arousal/Alertness: Awake/alert Behavior During Therapy: WFL for tasks assessed/performed Overall Cognitive Status: Within Functional Limits for tasks assessed                                        General Comments General comments (skin integrity, edema, etc.): Pt steady with static standing with feet apart and together with both eyes open and eyes closed.    Exercises Total Joint Exercises Ankle Circles/Pumps: AROM;Strengthening;Both;15 reps;10 reps Quad Sets: Strengthening;Both;10 reps;15 reps Gluteal Sets: Strengthening;10 reps;Both;15 reps Hip ABduction/ADduction: AROM;Both;5 reps Long Arc Quad: AROM;Both;10 reps Knee Flexion: AROM;Both;10 reps Marching in Standing: AROM;Both;10 reps;Standing Other Exercises Other Exercises: HEP education and review for BLE APs, GS, QS, and LAQs Other Exercises: Principles of activity progression education provided to pt   Assessment/Plan    PT Assessment Patient needs continued PT services  PT Problem List Decreased strength;Decreased activity tolerance       PT Treatment Interventions Gait training;Stair training;Therapeutic activities;Therapeutic exercise;Patient/family education    PT Goals (Current goals can be found in the Care Plan section)  Acute Rehab PT Goals Patient Stated Goal: To get stronger and return home PT Goal Formulation: With patient Time For Goal Achievement: 10/06/18 Potential to Achieve Goals: Good    Frequency Min 2X/week   Barriers to discharge        Co-evaluation               AM-PAC PT "6 Clicks" Mobility  Outcome Measure Help needed turning from your back  to your side while in a flat bed without using bedrails?: None Help needed moving from lying on your back to sitting on the side of a flat bed without using bedrails?: None Help needed moving to and from a bed to a chair (including a wheelchair)?: None Help needed standing up from a chair using your arms (e.g., wheelchair or bedside chair)?: None Help needed to walk in hospital room?: A Little Help needed climbing 3-5 steps with a railing? : A Little 6 Click Score: 22    End of Session Equipment Utilized During Treatment: Gait belt Activity Tolerance: Patient tolerated treatment well Patient left: in chair;with call bell/phone within reach Nurse Communication: Mobility status PT Visit Diagnosis: Muscle weakness (generalized) (M62.81)    Time: 1423-1500 PT Time Calculation (min) (ACUTE ONLY): 37 min   Charges:   PT Evaluation $PT Eval Low Complexity: 1 Low PT Treatments $Therapeutic Exercise: 8-22 mins        D. Elly ModenaScott Lakisa Lotz PT, DPT 09/23/18, 3:53 PM

## 2018-09-24 LAB — COMPREHENSIVE METABOLIC PANEL
ALT: 49 U/L — ABNORMAL HIGH (ref 0–44)
AST: 43 U/L — ABNORMAL HIGH (ref 15–41)
Albumin: 2.8 g/dL — ABNORMAL LOW (ref 3.5–5.0)
Alkaline Phosphatase: 74 U/L (ref 38–126)
Anion gap: 7 (ref 5–15)
BUN: 16 mg/dL (ref 8–23)
CO2: 24 mmol/L (ref 22–32)
Calcium: 8.5 mg/dL — ABNORMAL LOW (ref 8.9–10.3)
Chloride: 100 mmol/L (ref 98–111)
Creatinine, Ser: 1.39 mg/dL — ABNORMAL HIGH (ref 0.44–1.00)
GFR calc Af Amer: 47 mL/min — ABNORMAL LOW (ref >60)
GFR calc non Af Amer: 40 mL/min — ABNORMAL LOW (ref >60)
Glucose, Bld: 98 mg/dL (ref 70–99)
Potassium: 4.1 mmol/L (ref 3.5–5.1)
Sodium: 131 mmol/L — ABNORMAL LOW (ref 135–145)
Total Bilirubin: 0.8 mg/dL (ref 0.3–1.2)
Total Protein: 6.3 g/dL — ABNORMAL LOW (ref 6.5–8.1)

## 2018-09-24 LAB — LACTATE DEHYDROGENASE: LDH: 136 U/L (ref 98–192)

## 2018-09-24 LAB — CBC
HCT: 27.8 % — ABNORMAL LOW (ref 36.0–46.0)
Hemoglobin: 9.2 g/dL — ABNORMAL LOW (ref 12.0–15.0)
MCH: 28.5 pg (ref 26.0–34.0)
MCHC: 33.1 g/dL (ref 30.0–36.0)
MCV: 86.1 fL (ref 80.0–100.0)
Platelets: 106 10*3/uL — ABNORMAL LOW (ref 150–400)
RBC: 3.23 MIL/uL — ABNORMAL LOW (ref 3.87–5.11)
RDW: 12.2 % (ref 11.5–15.5)
WBC: 5.5 10*3/uL (ref 4.0–10.5)
nRBC: 0 % (ref 0.0–0.2)

## 2018-09-24 MED ORDER — ONDANSETRON HCL 4 MG PO TABS: 4.0000 mg | ORAL_TABLET | Freq: Four times a day (QID) | ORAL | 0 refills | Status: DC | PRN

## 2018-09-24 MED ORDER — LOPERAMIDE HCL 2 MG PO CAPS: 2.0000 mg | ORAL_CAPSULE | ORAL | 0 refills | Status: DC | PRN

## 2018-09-24 NOTE — Discharge Summary (Signed)
[facility] [place] [facility]   PATIENT NAME: Jamie RippleRebecca Foley    MR#:  [id number]  DATE OF BIRTH:  1956-03-26  DATE OF ADMISSION:  09/20/2018   ADMITTING PHYSICIAN: Shaune PollackQing Chen, MD  DATE OF DISCHARGE: 09/24/18  PRIMARY CARE PHYSICIAN: Jerl MinaHedrick, James, MD   ADMISSION DIAGNOSIS:  Dehydration [E86.0] AKI (acute kidney injury) (HCC) [N17.9] Nausea vomiting and diarrhea [R11.2, R19.7] DISCHARGE DIAGNOSIS:  Active Problems:   Gastroenteritis  SECONDARY DIAGNOSIS:   Past Medical History:  Diagnosis Date  . Anemia   . Arthritis   . Complication of anesthesia    HARD TO WAKE UP AFTER ANKLE SURGERY-FELT LIKE SHE RECEIVED TO MUCH ANESTHESIA  . Osteoporosis   . Polycystic kidney disease   . Renal insufficiency    HOSPITAL COURSE:   Jamie JoinerRebecca is a 63 year old female who presented to the ED with nausea, vomiting, diarrhea, and abdominal pain for 3 days.  She was also noted to have a fever to 102F in the ED.  CT abdomen/pelvis showed autosomal dominant polycystic kidney disease, but no other acute abnormalities. She was admitted for further management.  Nausea/vomiting/diarrhea- likely due to gastroenteritis -CT abdomen/pelvis with no acute abnormalities -C. difficile and GI pathogen panel negative -Seen by GI, who felt this was food poisoning -Case discussed with urology, who did not feel that patient had an infected cyst as a cause of her abdominal pain. -Needs to f/u with GI as an outpatient  Acute kidney injury- likely due to dehydration -Improved with IV fluids  Autosomal dominant polycystic kidney disease- patient has been seen for this in the past at [facility] -Needs to follow-up with [facility] urology on discharge  Normocytic anemia- likely due to chronic kidney disease -Anemia panel unremarkable -Recheck CBC as an outpatient  DISCHARGE CONDITIONS:  Viral gastroenteritis AKI Autosomal dominant polycystic kidney disease Normocytic anemia CONSULTS  OBTAINED:   DRUG ALLERGIES:   Allergies  Allergen Reactions  . Augmentin [Amoxicillin-Pot Clavulanate] Nausea Only and Other (See Comments)    Stomach pain   . Levofloxacin Other (See Comments)  . Prednisone Nausea And Vomiting and Other (See Comments)    Doubled over with stomach pain and couldn't sleep   DISCHARGE MEDICATIONS:   Allergies as of 09/24/2018      Reactions   Augmentin [amoxicillin-pot Clavulanate] Nausea Only, Other (See Comments)   Stomach pain    Levofloxacin Other (See Comments)   Prednisone Nausea And Vomiting, Other (See Comments)   Doubled over with stomach pain and couldn't sleep      Medication List    TAKE these medications   loperamide 2 MG capsule Commonly known as: IMODIUM Take 1 capsule (2 mg total) by mouth as needed for diarrhea or loose stools.   ondansetron 4 MG tablet Commonly known as: ZOFRAN Take 1 tablet (4 mg total) by mouth every 6 (six) hours as needed for nausea.        DISCHARGE INSTRUCTIONS:  1.  Follow-up with PCP in 5 days 2.  Follow-up with GI in 3 to 4 weeks 3.  Follow-up with [facility] urology in 2 weeks DIET:  Parke SimmersBland diet DISCHARGE CONDITION:  Stable ACTIVITY:  Activity as tolerated OXYGEN:  Home Oxygen: No.  Oxygen Delivery: room air DISCHARGE LOCATION:  home   If you experience worsening of your admission symptoms, develop shortness of breath, life threatening emergency, suicidal or homicidal thoughts you must seek medical attention immediately by calling 911 or calling your MD immediately  if symptoms less severe.  You Must read complete instructions/literature along with all the possible adverse reactions/side effects for all the Medicines you take and that have been prescribed to you. Take any new Medicines after you have completely understood and accpet all the possible adverse reactions/side effects.   Please note  You were cared for by a hospitalist during your hospital stay. If you have any questions about  your discharge medications or the care you received while you were in the hospital after you are discharged, you can call the unit and asked to speak with the hospitalist on call if the hospitalist that took care of you is not available. Once you are discharged, your primary care physician will handle any further medical issues. Please note that NO REFILLS for any discharge medications will be authorized once you are discharged, as it is imperative that you return to your primary care physician (or establish a relationship with a primary care physician if you do not have one) for your aftercare needs so that they can reassess your need for medications and monitor your lab values.    On the day of Discharge:  VITAL SIGNS:  Blood pressure 135/88, pulse (!) 101, temperature 98.6 F (37 C), temperature source Oral, resp. rate 18, height 5\' 5"  (1.651 m), weight 47.2 kg, SpO2 98 %. PHYSICAL EXAMINATION:  GENERAL:  63 y.o.-year-old patient lying in the bed with no acute distress.  Thin appearing. EYES: Pupils equal, round, reactive to light and accommodation. No scleral icterus. Extraocular muscles intact.  HEENT: Head atraumatic, normocephalic. Oropharynx and nasopharynx clear.  NECK:  Supple, no jugular venous distention. No thyroid enlargement, no tenderness.  LUNGS: Normal breath sounds bilaterally, no wheezing, rales,rhonchi or crepitation. No use of accessory muscles of respiration.  CARDIOVASCULAR: S1, S2 normal. No murmurs, rubs, or gallops.  ABDOMEN: Soft, non-distended. Bowel sounds present. No organomegaly or mass. + Mild generalized tenderness to palpation, no rebound or guarding. EXTREMITIES: No pedal edema, cyanosis, or clubbing.  NEUROLOGIC: Cranial nerves II through XII are intact. Muscle strength 5/5 in all extremities. Sensation intact. Gait not checked.  PSYCHIATRIC: The patient is alert and oriented x 3.  SKIN: No obvious rash, lesion, or ulcer.  DATA REVIEW:   CBC Recent Labs   Lab 09/24/18 0600  WBC 5.5  HGB 9.2*  HCT 27.8*  PLT 106*    Chemistries  Recent Labs  Lab 09/23/18 0720 09/24/18 0600  NA 133* 131*  K 4.3 4.1  CL 103 100  CO2 23 24  GLUCOSE 102* 98  BUN 18 16  CREATININE 1.42* 1.39*  CALCIUM 8.3* 8.5*  MG 1.9  --   AST  --  43*  ALT  --  49*  ALKPHOS  --  74  BILITOT  --  0.8     Microbiology Results  Results for orders placed or performed during the hospital encounter of 09/20/18  SARS Coronavirus 2 (CEPHEID - Performed in [facility] hospital lab), Hosp Order     Status: None   Collection Time: 09/20/18  1:15 PM   Specimen: Nasopharyngeal Swab  Result Value Ref Range Status   SARS Coronavirus 2 NEGATIVE NEGATIVE Final    Comment: (NOTE) If result is NEGATIVE SARS-CoV-2 target nucleic acids are NOT DETECTED. The SARS-CoV-2 RNA is generally detectable in upper and lower  respiratory specimens during the acute phase of infection. The lowest  concentration of SARS-CoV-2 viral copies this assay can detect is 250  copies / mL. A negative result does not preclude SARS-CoV-2  infection  and should not be used as the sole basis for treatment or other  patient management decisions.  A negative result may occur with  improper specimen collection / handling, submission of specimen other  than nasopharyngeal swab, presence of viral mutation(s) within the  areas targeted by this assay, and inadequate number of viral copies  (<250 copies / mL). A negative result must be combined with clinical  observations, patient history, and epidemiological information. If result is POSITIVE SARS-CoV-2 target nucleic acids are DETECTED. The SARS-CoV-2 RNA is generally detectable in upper and lower  respiratory specimens dur ing the acute phase of infection.  Positive  results are indicative of active infection with SARS-CoV-2.  Clinical  correlation with patient history and other diagnostic information is  necessary to determine patient infection  status.  Positive results do  not rule out bacterial infection or co-infection with other viruses. If result is PRESUMPTIVE POSTIVE SARS-CoV-2 nucleic acids MAY BE PRESENT.   A presumptive positive result was obtained on the submitted specimen  and confirmed on repeat testing.  While 2019 novel coronavirus  (SARS-CoV-2) nucleic acids may be present in the submitted sample  additional confirmatory testing may be necessary for epidemiological  and / or clinical management purposes  to differentiate between  SARS-CoV-2 and other Sarbecovirus currently known to infect humans.  If clinically indicated additional testing with an alternate test  methodology [phone]) is advised. The SARS-CoV-2 RNA is generally  detectable in upper and lower respiratory sp ecimens during the acute  phase of infection. The expected result is Negative. Fact Sheet for Patients:  StrictlyIdeas.no Fact Sheet for Healthcare Providers: BankingDealers.co.za This test is not yet approved or cleared by the [place] FDA and has been authorized for detection and/or diagnosis of SARS-CoV-2 by FDA under an Emergency Use Authorization (EUA).  This EUA will remain in effect (meaning this test can be used) for the duration of the COVID-19 declaration under Section 564(b)(1) of the Act, 21 U.S.C. section 360bbb-3(b)(1), unless the authorization is terminated or revoked sooner. Performed at [facility], [address] [place], [place] [address]   Blood culture (routine x 2)     Status: None (Preliminary result)   Collection Time: 09/20/18  4:58 PM   Specimen: BLOOD  Result Value Ref Range Status   Specimen Description BLOOD BLOOD LEFT ARM  Final   Special Requests   Final    BOTTLES DRAWN AEROBIC AND ANAEROBIC Blood Culture adequate volume   Culture   Final    NO GROWTH 4 DAYS Performed at [facility], [address], [place], [place] [address]     Report Status PENDING  Incomplete  Blood culture (routine x 2)     Status: None (Preliminary result)   Collection Time: 09/20/18  4:58 PM   Specimen: BLOOD  Result Value Ref Range Status   Specimen Description BLOOD BLOOD RIGHT ARM  Final   Special Requests   Final    BOTTLES DRAWN AEROBIC AND ANAEROBIC Blood Culture results may not be optimal due to an inadequate volume of blood received in culture bottles   Culture   Final    NO GROWTH 4 DAYS Performed at [facility], [address], [place],  [address]    Report Status PENDING  Incomplete  C difficile quick scan w PCR reflex     Status: None   Collection Time: 09/20/18  7:13 PM   Specimen: STOOL  Result Value Ref Range Status  C Diff antigen NEGATIVE NEGATIVE Final   C Diff toxin NEGATIVE NEGATIVE Final   C Diff interpretation No C. difficile detected.  Final    Comment: Performed at [facility], [address] [place], [place] [address]  Gastrointestinal Panel by PCR , Stool     Status: None   Collection Time: 09/21/18  7:13 PM   Specimen: STOOL  Result Value Ref Range Status   Campylobacter species NOT DETECTED NOT DETECTED Final   Plesimonas shigelloides NOT DETECTED NOT DETECTED Final   Salmonella species NOT DETECTED NOT DETECTED Final   Yersinia enterocolitica NOT DETECTED NOT DETECTED Final   Vibrio species NOT DETECTED NOT DETECTED Final   Vibrio cholerae NOT DETECTED NOT DETECTED Final   Enteroaggregative E coli (EAEC) NOT DETECTED NOT DETECTED Final   Enteropathogenic E coli (EPEC) NOT DETECTED NOT DETECTED Final   Enterotoxigenic E coli (ETEC) NOT DETECTED NOT DETECTED Final   Shiga like toxin producing E coli (STEC) NOT DETECTED NOT DETECTED Final   Shigella/Enteroinvasive E coli (EIEC) NOT DETECTED NOT DETECTED Final   Cryptosporidium NOT DETECTED NOT DETECTED Final   Cyclospora cayetanensis NOT DETECTED NOT DETECTED Final   Entamoeba histolytica NOT DETECTED NOT DETECTED Final    Giardia lamblia NOT DETECTED NOT DETECTED Final   Adenovirus F40/41 NOT DETECTED NOT DETECTED Final   Astrovirus NOT DETECTED NOT DETECTED Final   Norovirus GI/GII NOT DETECTED NOT DETECTED Final   Rotavirus A NOT DETECTED NOT DETECTED Final   Sapovirus (I, II, IV, and V) NOT DETECTED NOT DETECTED Final    Comment: Performed at [facility], [address] [place], [place] [address]    RADIOLOGY:  Dg Chest 1 View  Result Date: 09/23/2018 CLINICAL DATA:  Admitted recently for food poisoning.  Cough. EXAM: CHEST  1 VIEW COMPARISON:  09/20/2018 FINDINGS: Lungs are adequately inflated and otherwise clear. Cardiomediastinal silhouette and remainder of the exam is unchanged. IMPRESSION: No active disease. Electronically Signed   By: Elberta Fortisaniel  Boyle M.D.   On: 09/23/2018 11:04    Management plans discussed with the patient, family and they are in agreement.  CODE STATUS: Full Code   TOTAL TIME TAKING CARE OF THIS PATIENT: 45 minutes.    Jinny BlossomKaty D Daqwan Dougal M.D on 09/24/2018 at 9:47 AM  Between 7am to 6pm - Pager - [phone]  After 6pm go to [email or web address] - Social research officer, governmentpassword [email or web address]  [facility] [place] [facility]  Office  [phone]  CC: Primary care physician; Jerl MinaHedrick, James, MD   Note: This dictation was prepared with Dragon dictation along with smaller phrase technology. Any transcriptional errors that result from this process are unintentional.

## 2018-09-24 NOTE — Discharge Instructions (Signed)
It was so nice to meet you during this hospitalization!  You came into the hospital with nausea, vomiting, and diarrhea. We think that you had a pretty bad case of food poisoning.  I have prescribed the following medications: 1. Immodium- take 1 tablet every 4 hours as needed for diarrhea 2. Zofran- take 1 tablet every 6 hours as needed for nausea/vomiting  Make sure you are drinking plenty of fluids over the next couple of days!  Take care, Dr. Brett Albino

## 2018-09-25 LAB — CULTURE, BLOOD (ROUTINE X 2)
Culture: NO GROWTH
Culture: NO GROWTH
Special Requests: ADEQUATE

## 2018-09-25 LAB — HAPTOGLOBIN: Haptoglobin: 319 mg/dL (ref 37–355)

## 2018-09-27 DIAGNOSIS — K5900 Constipation, unspecified: Secondary | ICD-10-CM | POA: Insufficient documentation

## 2018-09-27 DIAGNOSIS — R1013 Epigastric pain: Secondary | ICD-10-CM | POA: Insufficient documentation

## 2018-11-02 ENCOUNTER — Encounter: Payer: Self-pay | Admitting: Gastroenterology

## 2018-11-02 ENCOUNTER — Other Ambulatory Visit: Payer: Self-pay

## 2018-11-02 ENCOUNTER — Ambulatory Visit (INDEPENDENT_AMBULATORY_CARE_PROVIDER_SITE_OTHER): Payer: BC Managed Care – PPO | Admitting: Gastroenterology

## 2018-11-02 VITALS — BP 157/83 | HR 85 | Ht 64.0 in | Wt 101.2 lb

## 2018-11-02 DIAGNOSIS — D696 Thrombocytopenia, unspecified: Secondary | ICD-10-CM | POA: Diagnosis not present

## 2018-11-02 DIAGNOSIS — Q613 Polycystic kidney, unspecified: Secondary | ICD-10-CM | POA: Insufficient documentation

## 2018-11-02 DIAGNOSIS — D649 Anemia, unspecified: Secondary | ICD-10-CM

## 2018-11-02 DIAGNOSIS — M542 Cervicalgia: Secondary | ICD-10-CM | POA: Insufficient documentation

## 2018-11-02 DIAGNOSIS — E785 Hyperlipidemia, unspecified: Secondary | ICD-10-CM | POA: Insufficient documentation

## 2018-11-02 NOTE — Progress Notes (Signed)
[name], MD [address]  [place]  [place], [place] [address]  Main: [phone]  Fax: [phone]    Gastroenterology Consultation  Referring Provider:     Maryland Pink, MD Primary Care Physician:  Maryland Pink, MD Primary Gastroenterologist:  Dr. [name] Reason for Consultation:     Hospital follow-up, anemia        HPI:   Jamie Foley is a 63 y.o. female referred by Dr. Maryland Pink, MD  for consultation & management of normocytic anemia.  Patient was admitted to [facility] on 09/22/2018 secondary to acute gastroenteritis that developed after eating hot dog.  She had CT abdomen and pelvis without contrast which was unremarkable.  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  She does have chronic normocytic anemia.  Iron panel is normal. She is here as a hospital follow-up.  Her gastroenteritis has resolved.  She denies any GI symptoms since she was discharged.  Her appetite is good, weight is stable, she denies rectal bleeding, melena, abdominal pain.  She has appointment to see the nephrologist at [facility].   She does not smoke or drink alcohol  NSAIDs: None  Antiplts/Anticoagulants/Anti thrombotics: None  GI Procedures: Reports having had a colonoscopy about 1 to 2 years ago and normal She denies family history of GI malignancy  Past Medical History:  Diagnosis Date   Anemia    Arthritis    Complication of anesthesia    HARD TO WAKE UP AFTER ANKLE SURGERY-FELT LIKE SHE RECEIVED TO MUCH ANESTHESIA   Osteoporosis    Polycystic kidney disease    Renal insufficiency     Past Surgical History:  Procedure Laterality Date   ABDOMINAL HYSTERECTOMY     CATARACT EXTRACTION W/PHACO Left 10/14/2014   Procedure: CATARACT EXTRACTION PHACO AND INTRAOCULAR LENS PLACEMENT ([place]);  Surgeon: Estill Cotta, MD;  Location: [facility] ORS;  Service: Ophthalmology;  Laterality: Left;  [place] 01:09 AP% 60.0 CDE 37.21 fluid pack lot  #[id number]   CHOLECYSTECTOMY     EYE SURGERY Left    DETACHED RETINA    HERNIA REPAIR     INGUINAL HERNIA REPAIR Left 08/03/2016   Procedure: HERNIA REPAIR INGUINAL ADULT;  Surgeon: Leonie Green, MD;  Location: [facility] ORS;  Service: General;  Laterality: Left;   right ankle fracture Right    VENTRAL HERNIA REPAIR N/A 08/03/2016   Procedure: HERNIA REPAIR VENTRAL ADULT;  Surgeon: Leonie Green, MD;  Location: [facility] ORS;  Service: General;  Laterality: N/A;    Current Outpatient Medications:    bisacodyl (DULCOLAX) 10 MG suppository, Place rectally., Disp: , Rfl:    loperamide (IMODIUM) 2 MG capsule, Take 1 capsule (2 mg total) by mouth as needed for diarrhea or loose stools. (Patient not taking: Reported on 11/02/2018), Disp: 30 capsule, Rfl: 0   ondansetron (ZOFRAN) 4 MG tablet, Take 1 tablet (4 mg total) by mouth every 6 (six) hours as needed for nausea. (Patient not taking: Reported on 11/02/2018), Disp: 20 tablet, Rfl: 0   senna-docusate (SENOKOT-S) 8.6-50 MG tablet, Take by mouth., Disp: , Rfl: '   History reviewed. No pertinent family history.   Social History   Tobacco Use   Smoking status: Never Smoker   Smokeless tobacco: Never Used  Substance Use Topics   Alcohol use: No   Drug use: No    Allergies as of 11/02/2018 - Review Complete 11/02/2018  Allergen Reaction Noted   Augmentin [amoxicillin-pot clavulanate] Nausea Only and  Other (See Comments) 10/11/2014   Levofloxacin Other (See Comments) 05/24/2016   Prednisone Nausea And Vomiting and Other (See Comments) 07/21/2016    Review of Systems:    All systems reviewed and negative except where noted in HPI.   Physical Exam:  BP (!) 157/83 (BP Location: Right Arm, Patient Position: Sitting, Cuff Size: Normal)    Pulse 85    Ht 5\' 4"  (1.626 m)    Wt 101 lb 3.2 oz (45.9 kg)    BMI 17.37 kg/m  No LMP recorded. Patient has had a hysterectomy.  General:   Alert, thin built, moderately nourished,  pleasant and cooperative in NAD Head:  Normocephalic and atraumatic. Eyes:  Sclera clear, no icterus.   Conjunctiva pink. Ears:  Normal auditory acuity. Nose:  No deformity, discharge, or lesions. Mouth:  No deformity or lesions,oropharynx pink & moist. Neck:  Supple; no masses or thyromegaly. Lungs:  Respirations even and unlabored.  Clear throughout to auscultation.   No wheezes, crackles, or rhonchi. No acute distress. Heart:  Regular rate and rhythm; no murmurs, clicks, rubs, or gallops. Abdomen:  Normal bowel sounds. Soft, non-tender and distended with palpable liver cysts, hepatomegaly, no splenomegaly or hernias noted.  No guarding or rebound tenderness.   Rectal: Not performed Msk:  Symmetrical without gross deformities. Good, equal movement & strength bilaterally. Pulses:  Normal pulses noted. Extremities:  No clubbing or edema.  No cyanosis. Neurologic:  Alert and oriented x3;  grossly normal neurologically. Skin:  Intact without significant lesions or rashes. No jaundice. Psych:  Alert and cooperative. Normal mood and affect.  Imaging Studies: Reviewed  Assessment and Plan:   Jamie Foley is a 63 y.o. female with adult polycystic kidney and liver disease seen as a hospital follow-up of gastroenteritis, anemia and thrombocytopenia.  Her gastroenteritis has resolved.  She does not have any GI symptoms today With regards to anemia and thrombocytopenia, patient does not have chronic liver disease other than multiple liver cysts secondary to her underlying adult polycystic condition.  She does not have iron deficiency.  She denies hematuria or vaginal bleeding I recommend upper endoscopy with gastric and duodenal biopsies for work-up of anemia Recommend referral to hematology for further evaluation of anemia and thrombocytopenia and patient is agreeable [facility]   Follow up as needed   Arlyss Repressohini R Nahome Bublitz, MD

## 2018-11-06 ENCOUNTER — Other Ambulatory Visit: Payer: Self-pay

## 2018-11-07 ENCOUNTER — Other Ambulatory Visit: Payer: Self-pay

## 2018-11-07 ENCOUNTER — Encounter: Payer: Self-pay | Admitting: Oncology

## 2018-11-07 ENCOUNTER — Inpatient Hospital Stay: Payer: BC Managed Care – PPO | Attending: Oncology | Admitting: Oncology

## 2018-11-07 ENCOUNTER — Inpatient Hospital Stay: Payer: BC Managed Care – PPO

## 2018-11-07 VITALS — BP 156/83 | HR 76 | Temp 98.0°F | Ht 64.0 in | Wt 99.3 lb

## 2018-11-07 DIAGNOSIS — D649 Anemia, unspecified: Secondary | ICD-10-CM

## 2018-11-07 DIAGNOSIS — D631 Anemia in chronic kidney disease: Secondary | ICD-10-CM | POA: Insufficient documentation

## 2018-11-07 DIAGNOSIS — Q612 Polycystic kidney, adult type: Secondary | ICD-10-CM | POA: Diagnosis not present

## 2018-11-07 DIAGNOSIS — Z79899 Other long term (current) drug therapy: Secondary | ICD-10-CM | POA: Insufficient documentation

## 2018-11-07 DIAGNOSIS — R0602 Shortness of breath: Secondary | ICD-10-CM | POA: Diagnosis not present

## 2018-11-07 DIAGNOSIS — D696 Thrombocytopenia, unspecified: Secondary | ICD-10-CM | POA: Diagnosis not present

## 2018-11-07 DIAGNOSIS — E860 Dehydration: Secondary | ICD-10-CM | POA: Insufficient documentation

## 2018-11-07 DIAGNOSIS — N189 Chronic kidney disease, unspecified: Secondary | ICD-10-CM | POA: Diagnosis not present

## 2018-11-07 DIAGNOSIS — E538 Deficiency of other specified B group vitamins: Secondary | ICD-10-CM | POA: Diagnosis not present

## 2018-11-07 LAB — CBC WITH DIFFERENTIAL/PLATELET
Abs Immature Granulocytes: 0.01 10*3/uL (ref 0.00–0.07)
Basophils Absolute: 0.1 10*3/uL (ref 0.0–0.1)
Basophils Relative: 1 %
Eosinophils Absolute: 0.3 10*3/uL (ref 0.0–0.5)
Eosinophils Relative: 6 %
HCT: 33.8 % — ABNORMAL LOW (ref 36.0–46.0)
Hemoglobin: 10.7 g/dL — ABNORMAL LOW (ref 12.0–15.0)
Immature Granulocytes: 0 %
Lymphocytes Relative: 20 %
Lymphs Abs: 1.2 10*3/uL (ref 0.7–4.0)
MCH: 27.9 pg (ref 26.0–34.0)
MCHC: 31.7 g/dL (ref 30.0–36.0)
MCV: 88 fL (ref 80.0–100.0)
Monocytes Absolute: 0.3 10*3/uL (ref 0.1–1.0)
Monocytes Relative: 6 %
Neutro Abs: 3.9 10*3/uL (ref 1.7–7.7)
Neutrophils Relative %: 67 %
Platelets: 125 10*3/uL — ABNORMAL LOW (ref 150–400)
RBC: 3.84 MIL/uL — ABNORMAL LOW (ref 3.87–5.11)
RDW: 12.9 % (ref 11.5–15.5)
WBC: 5.8 10*3/uL (ref 4.0–10.5)
nRBC: 0 % (ref 0.0–0.2)

## 2018-11-07 LAB — TECHNOLOGIST SMEAR REVIEW

## 2018-11-07 LAB — RETIC PANEL
Immature Retic Fract: 1.7 % — ABNORMAL LOW (ref 2.3–15.9)
RBC.: 3.84 MIL/uL — ABNORMAL LOW (ref 3.87–5.11)
Retic Count, Absolute: 36.1 10*3/uL (ref 19.0–186.0)
Retic Ct Pct: 0.9 % (ref 0.4–3.1)
Reticulocyte Hemoglobin: 33.5 pg (ref >27.9)

## 2018-11-07 LAB — FERRITIN: Ferritin: 78 ng/mL (ref 11–307)

## 2018-11-07 LAB — IRON AND TIBC
Iron: 57 ug/dL (ref 28–170)
Saturation Ratios: 20 % (ref 10.4–31.8)
TIBC: 288 ug/dL (ref 250–450)
UIBC: 231 ug/dL

## 2018-11-07 LAB — TSH: TSH: 2.691 u[IU]/mL (ref 0.350–4.500)

## 2018-11-07 LAB — IMMATURE PLATELET FRACTION: Immature Platelet Fraction: 4.7 % (ref 1.2–8.6)

## 2018-11-07 NOTE — Progress Notes (Signed)
Hematology/Oncology Consult note [facility] [phone] [phone]   Patient Care Team: Maryland Pink, MD as PCP - General (Family Medicine) Maryland Pink, MD as Consulting Physician (Family Medicine) Christene Lye, MD (General Surgery)  REFERRING PROVIDER: Lin Landsman, MD  CHIEF COMPLAINTS/REASON FOR VISIT:  Evaluation of anemia  HISTORY OF PRESENTING ILLNESS:  Jamie Foley is a  63 y.o.  female with PMH listed below who was referred to me for evaluation of anemia Reviewed patient's recent labs that was done.  09/24/2018 Labs revealed anemia with hemoglobin of 9.2, platelet 106,000, Patient was recently admitted from 09/19/2020 09/24/2018 due to nausea, vomiting, diarrhea, secondary to gastroenteritis. CT abdomen pelvis showed no acute abnormalities.  C. difficile negative.  GI pathogens panel negative.  Was seen and evaluated by gastroenterology, felt patient had food poisoning. She also had AKI due to dehydration.  Improved after IV fluids. She had a history of autosomal dominant polycystic kidney disease, she has been following up with nephrologist at [facility].  She had a follow-up appointment with Dr. Lin Landsman on 11/02/2019 patient was referred to heme-onc to Establish care for evaluation of anemia.  Associated signs and symptoms: Patient reports fatigue.  SOB with exertion.  Denies weight loss, easy bruising, hematochezia, hemoptysis, hematuria. Context: History of GI bleeding: Denies               History of Chronic kidney disease; history of polycystic kidney disease.                               Last colonoscopy: Reports that she had colonoscopy about 1 to 2 years ago at outside facility.                 Review of Systems  Constitutional: Positive for fatigue. Negative for appetite change, chills and fever.  HENT:   Negative for hearing loss and voice change.   Eyes: Negative for eye problems.    Respiratory: Negative for chest tightness and cough.   Cardiovascular: Negative for chest pain.  Gastrointestinal: Negative for abdominal distention, abdominal pain and blood in stool.  Endocrine: Negative for hot flashes.  Genitourinary: Negative for difficulty urinating and frequency.   Musculoskeletal: Negative for arthralgias.  Skin: Negative for itching and rash.  Neurological: Negative for extremity weakness.  Hematological: Negative for adenopathy.  Psychiatric/Behavioral: Negative for confusion.     MEDICAL HISTORY:  Past Medical History:  Diagnosis Date   Anemia    Arthritis    Complication of anesthesia    HARD TO WAKE UP AFTER ANKLE SURGERY-FELT LIKE SHE RECEIVED TO MUCH ANESTHESIA   Osteoporosis    Polycystic kidney disease    Renal insufficiency     SURGICAL HISTORY: Past Surgical History:  Procedure Laterality Date   ABDOMINAL HYSTERECTOMY     CATARACT EXTRACTION W/PHACO Left 10/14/2014   Procedure: CATARACT EXTRACTION PHACO AND INTRAOCULAR LENS PLACEMENT ([place]);  Surgeon: Estill Cotta, MD;  Location: [facility] ORS;  Service: Ophthalmology;  Laterality: Left;  [place] 01:09 AP% 60.0 CDE 37.21 fluid pack lot #[id number]   CHOLECYSTECTOMY     EYE SURGERY Left    DETACHED RETINA    HERNIA REPAIR     INGUINAL HERNIA REPAIR Left 08/03/2016   Procedure: HERNIA REPAIR INGUINAL ADULT;  Surgeon: Leonie Green, MD;  Location: [facility] ORS;  Service: General;  Laterality: Left;   right ankle fracture Right  VENTRAL HERNIA REPAIR N/A 08/03/2016   Procedure: HERNIA REPAIR VENTRAL ADULT;  Surgeon: Leonie Green, MD;  Location: [facility] ORS;  Service: General;  Laterality: N/A;    SOCIAL HISTORY: Social History   Socioeconomic History   Marital status: Legally Separated    Spouse name: Not on file   Number of children: Not on file   Years of education: Not on file   Highest education level: Not on file  Occupational History   Not on file  Social  Needs   Financial resource strain: Not on file   Food insecurity    Worry: Not on file    Inability: Not on file   Transportation needs    Medical: Not on file    Non-medical: Not on file  Tobacco Use   Smoking status: Never Smoker   Smokeless tobacco: Never Used  Substance and Sexual Activity   Alcohol use: No   Drug use: No   Sexual activity: Not on file  Lifestyle   Physical activity    Days per week: Not on file    Minutes per session: Not on file   Stress: Not on file  Relationships   Social connections    Talks on phone: Not on file    Gets together: Not on file    Attends religious service: Not on file    Active member of club or organization: Not on file    Attends meetings of clubs or organizations: Not on file    Relationship status: Not on file   Intimate partner violence    Fear of current or ex partner: Not on file    Emotionally abused: Not on file    Physically abused: Not on file    Forced sexual activity: Not on file  Other Topics Concern   Not on file  Social History Narrative   Not on file    FAMILY HISTORY: Family History  Problem Relation Age of Onset   Heart attack Father 19    ALLERGIES:  is allergic to augmentin [amoxicillin-pot clavulanate]; levofloxacin; and prednisone.  MEDICATIONS:  Current Outpatient Medications  Medication Sig Dispense Refill   bisacodyl (DULCOLAX) 10 MG suppository Place rectally.     loperamide (IMODIUM) 2 MG capsule Take 1 capsule (2 mg total) by mouth as needed for diarrhea or loose stools. (Patient not taking: Reported on 11/02/2018) 30 capsule 0   ondansetron (ZOFRAN) 4 MG tablet Take 1 tablet (4 mg total) by mouth every 6 (six) hours as needed for nausea. (Patient not taking: Reported on 11/02/2018) 20 tablet 0   senna-docusate (SENOKOT-S) 8.6-50 MG tablet Take by mouth.     No current facility-administered medications for this visit.      PHYSICAL EXAMINATION: ECOG PERFORMANCE STATUS: 1  - Symptomatic but completely ambulatory Vitals:   11/07/18 1054  BP: (!) 156/83  Pulse: 76  Temp: 98 F (36.7 C)   Filed Weights   11/07/18 1054  Weight: 99 lb 4.8 oz (45 kg)    Physical Exam Constitutional:      General: She is not in acute distress.    Comments: Thin, walk in   HENT:     Head: Normocephalic and atraumatic.  Eyes:     General: No scleral icterus.    Pupils: Pupils are equal, round, and reactive to light.  Neck:     Musculoskeletal: Normal range of motion and neck supple.  Cardiovascular:     Rate and Rhythm: Normal rate and regular rhythm.  Heart sounds: Normal heart sounds.  Pulmonary:     Effort: Pulmonary effort is normal. No respiratory distress.     Breath sounds: No wheezing.  Abdominal:     General: Bowel sounds are normal. There is no distension.     Palpations: Abdomen is soft. There is no mass.     Tenderness: There is no abdominal tenderness.  Musculoskeletal: Normal range of motion.        General: No deformity.  Skin:    General: Skin is warm and dry.     Findings: No erythema or rash.  Neurological:     Mental Status: She is alert and oriented to person, place, and time.     Cranial Nerves: No cranial nerve deficit.     Coordination: Coordination normal.  Psychiatric:        Behavior: Behavior normal.        Thought Content: Thought content normal.      LABORATORY DATA:  I have reviewed the data as listed Lab Results  Component Value Date   WBC 5.8 11/07/2018   HGB 10.7 (L) 11/07/2018   HCT 33.8 (L) 11/07/2018   MCV 88.0 11/07/2018   PLT 125 (L) 11/07/2018   Recent Labs    09/20/18 1315  09/22/18 0427 09/23/18 0720 09/24/18 0600  NA 134*   < > 135 133* 131*  K 3.1*   < > 4.6 4.3 4.1  CL 97*   < > 106 103 100  CO2 25   < > 20* 23 24  GLUCOSE 120*   < > 118* 102* 98  BUN 34*   < > 24* 18 16  CREATININE 1.79*   < > 1.33* 1.42* 1.39*  CALCIUM 8.3*   < > 7.9* 8.3* 8.5*  GFRNONAA 30*   < > 42* 39* 40*  GFRAA 34*    < > 49* 45* 47*  PROT 7.4  --   --   --  6.3*  ALBUMIN 3.5  --   --   --  2.8*  AST 17  --   --   --  43*  ALT 17  --   --   --  49*  ALKPHOS 57  --   --   --  74  BILITOT 1.7*  --   --   --  0.8   < > = values in this interval not displayed.   Iron/TIBC/Ferritin/ %Sat    Component Value Date/Time   IRON 57 11/07/2018 1130   TIBC 288 11/07/2018 1130   FERRITIN 78 11/07/2018 1130   IRONPCTSAT 20 11/07/2018 1130    09/22/2018 folate 46, vitamin B12 305 09/21/2018, HIV negative.  RADIOGRAPHIC STUDIES: I have personally reviewed the radiological images as listed and agreed with the findings in the report. Ct Abdomen Pelvis Wo Contrast  Result Date: 09/20/2018 CLINICAL DATA:  Abdominal pain. EXAM: CT ABDOMEN AND PELVIS WITHOUT CONTRAST TECHNIQUE: Multidetector CT imaging of the abdomen and pelvis was performed following the standard protocol without IV contrast. COMPARISON:  03/11/2009 FINDINGS: Lower chest: No acute abnormality. Hepatobiliary: Polycystic liver is again identified. There are innumerable fluid density cysts identified throughout both lobes of the liver. The dominant cyst arises from the posterior aspect of the lateral segment of left lobe of liver measuring 7.4 cm. Previously this cyst measured 4.6 cm. Moderate intrahepatic and common bile duct dilatation is again noted as seen on 03/11/2009. The common bile duct measures up to 1.4 cm. No calcified stones identified  within the common bile duct. Pancreas: Unremarkable. No pancreatic ductal dilatation or surrounding inflammatory changes. Spleen: Normal in size without focal abnormality. Adrenals/Urinary Tract: Adrenal glands are difficult to identified. Bilateral polycystic kidneys are again noted. There are innumerable bilateral cystic kidney lesions throughout the enlarged kidneys. Since the previous exam multiple new scattered calcifications are associated with many of the cysts. A number of cysts are complicated by hemorrhage and  appear hyperdense. All these cysts are incompletely characterized without IV contrast and underlying cystic neoplasm or solid enhancing neoplasm would be difficult to exclude given lack of IV contrast material. No definite hydronephrosis identified bilaterally. There is been progressive enlargement of the right kidney which measures 13.6 x 7.9 by 11.9 cm (volume = 670 cm^3). Previously 9.1 x 5.2 by 10.0 cm (volume = 250 cm^3) Urinary bladder is unremarkable. Stomach/Bowel: Stomach is within normal limits. Enlarged right kidney has mass effect upon right abdominal bowel loops and displaces the ascending colon anteriorly. No evidence of bowel wall thickening, distention, or inflammatory changes. Numerous distal colonic diverticula identified without acute inflammation. Vascular/Lymphatic: Aortic atherosclerosis without aneurysm. No abdominopelvic adenopathy identified. Reproductive: Status post hysterectomy. No adnexal masses. Other: Trace free fluid identified within the pelvis. Musculoskeletal: No acute or significant osseous findings. IMPRESSION: 1. Innumerable kidney and liver cysts consistent with autosomal dominant polycystic kidney disease. 2. There is progressive right nephromegaly when compared with 03/11/2009. Right kidney has a volume of approximately 670 cc compared with 250 cc previously. The enlarged right kidney has mass effect and displaces the right abdominal bowel loops. 3.  Aortic Atherosclerosis (ICD10-I70.0). Electronically Signed   By: Kerby Moors M.D.   On: 09/20/2018 15:32   Dg Chest 1 View  Result Date: 09/23/2018 CLINICAL DATA:  Admitted recently for food poisoning.  Cough. EXAM: CHEST  1 VIEW COMPARISON:  09/20/2018 FINDINGS: Lungs are adequately inflated and otherwise clear. Cardiomediastinal silhouette and remainder of the exam is unchanged. IMPRESSION: No active disease. Electronically Signed   By: Marin Olp M.D.   On: 09/23/2018 11:04   Dg Chest Portable 1 View  Result  Date: 09/20/2018 CLINICAL DATA:  Pain after her abdominal hernia. Watery diarrhea with nausea and vomiting past 3 days. Fever. EXAM: PORTABLE CHEST 1 VIEW COMPARISON:  06/16/2010 FINDINGS: Patient slightly rotated to the left. Lungs are adequately inflated without airspace consolidation or effusion. Cardiomediastinal silhouette and remainder of the exam is unchanged. IMPRESSION: No acute findings. Electronically Signed   By: Marin Olp M.D.   On: 09/20/2018 16:49   ASSESSMENT & PLAN:  1. Anemia, unspecified type   2. Thrombocytopenia ([place])   Previous labs are reviewed and discussed with the patient. Anemia: multifactorial with possible causes including chronic blood loss, hyper/hypothyroidism, nutritional deficiency, infection/chronic inflammation, hemolysis, underlying bone marrow disorders. Will check CBC w differential, iron/TIBC, ferritin, reticulocytes,  blood smear, TSH,  LDH,monoclonal gammopathy evaluation.   Thrombocytopenia, check hepatitis B surface antigen and hepatitis C antibody, immature platelet fraction   Orders Placed This Encounter  Procedures   CBC with Differential/Platelet    Standing Status:   Future    Number of Occurrences:   1    Standing Expiration Date:   11/07/2019   Technologist smear review    Standing Status:   Future    Number of Occurrences:   1    Standing Expiration Date:   11/07/2019   TSH    Standing Status:   Future    Number of Occurrences:   1    Standing  Expiration Date:   11/07/2019   Hepatitis B surface antigen    Standing Status:   Future    Number of Occurrences:   1    Standing Expiration Date:   11/07/2019   Hepatitis C antibody    Standing Status:   Future    Number of Occurrences:   1    Standing Expiration Date:   11/07/2019   Multiple Myeloma Panel (SPEP&IFE w/QIG)    Standing Status:   Future    Number of Occurrences:   1    Standing Expiration Date:   11/07/2019   Kappa/lambda light chains    Standing Status:   Future     Number of Occurrences:   1    Standing Expiration Date:   11/07/2019   Iron and TIBC    Standing Status:   Future    Number of Occurrences:   1    Standing Expiration Date:   11/07/2019   Ferritin    Standing Status:   Future    Number of Occurrences:   1    Standing Expiration Date:   11/07/2019   Retic Panel    Standing Status:   Future    Number of Occurrences:   1    Standing Expiration Date:   11/07/2019   Immature Platelet Fraction    Standing Status:   Future    Number of Occurrences:   1    Standing Expiration Date:   11/07/2019    All questions were answered. The patient knows to call the clinic with any problems questions or concerns. Cc. Lin Landsman, MD  Return of visit: 2 weeks Thank you for this kind referral and the opportunity to participate in the care of this patient. A copy of today's note is routed to referring provider  Total face to face encounter time for this patient visit was 45mn. >50% of the time was  spent in counseling and coordination of care.    ZEarlie Server MD, PhD 11/07/2018

## 2018-11-08 LAB — MULTIPLE MYELOMA PANEL, SERUM
Albumin SerPl Elph-Mcnc: 4 g/dL (ref 2.9–4.4)
Albumin/Glob SerPl: 1.4 (ref 0.7–1.7)
Alpha 1: 0.2 g/dL (ref 0.0–0.4)
Alpha2 Glob SerPl Elph-Mcnc: 0.6 g/dL (ref 0.4–1.0)
B-Globulin SerPl Elph-Mcnc: 0.6 g/dL — ABNORMAL LOW (ref 0.7–1.3)
Gamma Glob SerPl Elph-Mcnc: 1.4 g/dL (ref 0.4–1.8)
Globulin, Total: 2.9 g/dL (ref 2.2–3.9)
IgA: 155 mg/dL (ref 87–352)
IgG (Immunoglobin G), Serum: 1664 mg/dL — ABNORMAL HIGH (ref 586–1602)
IgM (Immunoglobulin M), Srm: 91 mg/dL (ref 26–217)
Total Protein ELP: 6.9 g/dL (ref 6.0–8.5)

## 2018-11-08 LAB — KAPPA/LAMBDA LIGHT CHAINS
Kappa free light chain: 63.4 mg/L — ABNORMAL HIGH (ref 3.3–19.4)
Kappa, lambda light chain ratio: 1.36 (ref 0.26–1.65)
Lambda free light chains: 46.7 mg/L — ABNORMAL HIGH (ref 5.7–26.3)

## 2018-11-08 LAB — HEPATITIS C ANTIBODY: HCV Ab: <0.1 s/co ratio (ref 0.0–0.9)

## 2018-11-08 LAB — HEPATITIS B SURFACE ANTIGEN: Hepatitis B Surface Ag: NEGATIVE

## 2018-11-21 ENCOUNTER — Encounter: Payer: Self-pay | Admitting: Oncology

## 2018-11-21 ENCOUNTER — Other Ambulatory Visit: Payer: Self-pay

## 2018-11-21 ENCOUNTER — Inpatient Hospital Stay (HOSPITAL_BASED_OUTPATIENT_CLINIC_OR_DEPARTMENT_OTHER): Payer: BC Managed Care – PPO | Admitting: Oncology

## 2018-11-21 VITALS — BP 137/82 | HR 79 | Temp 98.2°F | Wt 100.3 lb

## 2018-11-21 DIAGNOSIS — D631 Anemia in chronic kidney disease: Secondary | ICD-10-CM

## 2018-11-21 DIAGNOSIS — D649 Anemia, unspecified: Secondary | ICD-10-CM | POA: Diagnosis not present

## 2018-11-21 DIAGNOSIS — E538 Deficiency of other specified B group vitamins: Secondary | ICD-10-CM

## 2018-11-21 DIAGNOSIS — N189 Chronic kidney disease, unspecified: Secondary | ICD-10-CM | POA: Diagnosis not present

## 2018-11-21 DIAGNOSIS — D696 Thrombocytopenia, unspecified: Secondary | ICD-10-CM

## 2018-11-21 NOTE — Progress Notes (Signed)
Pt here for follow up. No concerns noted.

## 2018-11-21 NOTE — Progress Notes (Signed)
Hematology/Oncology Consult note [facility] Telephone:([phone] [phone]   Patient Care Team: Maryland Pink, MD as PCP - General (Family Medicine) Maryland Pink, MD as Consulting Physician (Family Medicine) Christene Lye, MD (General Surgery)  REFERRING PROVIDER: Maryland Pink, MD  REASON FOR VISIT:  Follow-up for anemia  HISTORY OF PRESENTING ILLNESS:  Jamie Foley is a  63 y.o.  female with PMH listed below who was referred to me for evaluation of anemia Reviewed patient's recent labs that was done.  09/24/2018 Labs revealed anemia with hemoglobin of 9.2, platelet 106,000, Patient was recently admitted from 09/19/2020 09/24/2018 due to nausea, vomiting, diarrhea, secondary to gastroenteritis. CT abdomen pelvis showed no acute abnormalities.  C. difficile negative.  GI pathogens panel negative.  Was seen and evaluated by gastroenterology, felt patient had food poisoning. She also had AKI due to dehydration.  Improved after IV fluids. She had a history of autosomal dominant polycystic kidney disease, she has been following up with nephrologist at [facility].  She had a follow-up appointment with Dr. Lin Landsman on 11/02/2019 patient was referred to heme-onc to Establish care for evaluation of anemia.  Associated signs and symptoms: Patient reports fatigue.  SOB with exertion.  Denies weight loss, easy bruising, hematochezia, hemoptysis, hematuria. Context: History of GI bleeding: Denies               History of Chronic kidney disease; history of polycystic kidney disease.                               Last colonoscopy: Reports that she had colonoscopy about 1 to 2 years ago at outside facility.  INTERVAL HISTORY Jamie Foley is a 63 y.o. female who has above history reviewed by me today presents for follow up visit for management of anemia. Problems and complaints are listed below: Patient had blood work done at last visit.   Present to discuss results and management plan. She reports feeling slightly better recently.  Fatigue is better.  No new complaints.   Review of Systems  Constitutional: Positive for fatigue. Negative for appetite change, chills and fever.  HENT:   Negative for hearing loss and voice change.   Eyes: Negative for eye problems.  Respiratory: Negative for chest tightness and cough.   Cardiovascular: Negative for chest pain.  Gastrointestinal: Negative for abdominal distention, abdominal pain and blood in stool.  Endocrine: Negative for hot flashes.  Genitourinary: Negative for difficulty urinating and frequency.   Musculoskeletal: Negative for arthralgias.  Skin: Negative for itching and rash.  Neurological: Negative for extremity weakness.  Hematological: Negative for adenopathy.  Psychiatric/Behavioral: Negative for confusion.     MEDICAL HISTORY:  Past Medical History:  Diagnosis Date   Anemia    Arthritis    Complication of anesthesia    HARD TO WAKE UP AFTER ANKLE SURGERY-FELT LIKE SHE RECEIVED TO MUCH ANESTHESIA   Osteoporosis    Polycystic kidney disease    Renal insufficiency     SURGICAL HISTORY: Past Surgical History:  Procedure Laterality Date   ABDOMINAL HYSTERECTOMY     CATARACT EXTRACTION W/PHACO Left 10/14/2014   Procedure: CATARACT EXTRACTION PHACO AND INTRAOCULAR LENS PLACEMENT ([place]);  Surgeon: Estill Cotta, MD;  Location: [facility] ORS;  Service: Ophthalmology;  Laterality: Left;  [place] 01:09 AP% 60.0 CDE 37.21 fluid pack lot #[id number]   CHOLECYSTECTOMY     EYE SURGERY Left    DETACHED RETINA  HERNIA REPAIR     INGUINAL HERNIA REPAIR Left 08/03/2016   Procedure: HERNIA REPAIR INGUINAL ADULT;  Surgeon: Leonie Green, MD;  Location: [facility] ORS;  Service: General;  Laterality: Left;   right ankle fracture Right    VENTRAL HERNIA REPAIR N/A 08/03/2016   Procedure: HERNIA REPAIR VENTRAL ADULT;  Surgeon: Leonie Green, MD;  Location:  [facility] ORS;  Service: General;  Laterality: N/A;    SOCIAL HISTORY: Social History   Socioeconomic History   Marital status: Legally Separated    Spouse name: Not on file   Number of children: Not on file   Years of education: Not on file   Highest education level: Not on file  Occupational History   Not on file  Social Needs   Financial resource strain: Not on file   Food insecurity    Worry: Not on file    Inability: Not on file   Transportation needs    Medical: Not on file    Non-medical: Not on file  Tobacco Use   Smoking status: Never Smoker   Smokeless tobacco: Never Used  Substance and Sexual Activity   Alcohol use: No   Drug use: No   Sexual activity: Not on file  Lifestyle   Physical activity    Days per week: Not on file    Minutes per session: Not on file   Stress: Not on file  Relationships   Social connections    Talks on phone: Not on file    Gets together: Not on file    Attends religious service: Not on file    Active member of club or organization: Not on file    Attends meetings of clubs or organizations: Not on file    Relationship status: Not on file   Intimate partner violence    Fear of current or ex partner: Not on file    Emotionally abused: Not on file    Physically abused: Not on file    Forced sexual activity: Not on file  Other Topics Concern   Not on file  Social History Narrative   Not on file    FAMILY HISTORY: Family History  Problem Relation Age of Onset   Heart attack Father 33    ALLERGIES:  is allergic to augmentin [amoxicillin-pot clavulanate]; levofloxacin; and prednisone.  MEDICATIONS:  Current Outpatient Medications  Medication Sig Dispense Refill   bisacodyl (DULCOLAX) 10 MG suppository Place rectally.     loperamide (IMODIUM) 2 MG capsule Take 1 capsule (2 mg total) by mouth as needed for diarrhea or loose stools. 30 capsule 0   ondansetron (ZOFRAN) 4 MG tablet Take 1 tablet (4 mg total)  by mouth every 6 (six) hours as needed for nausea. 20 tablet 0   senna-docusate (SENOKOT-S) 8.6-50 MG tablet Take by mouth.     No current facility-administered medications for this visit.      PHYSICAL EXAMINATION: ECOG PERFORMANCE STATUS: 1 - Symptomatic but completely ambulatory Vitals:   11/21/18 1448  BP: 137/82  Pulse: 79  Temp: 98.2 F (36.8 C)   Filed Weights   11/21/18 1448  Weight: 100 lb 4.8 oz (45.5 kg)    Physical Exam Constitutional:      General: She is not in acute distress.    Comments: Thin, walk in   HENT:     Head: Normocephalic and atraumatic.  Eyes:     General: No scleral icterus.    Pupils: Pupils are equal, round, and reactive  to light.  Neck:     Musculoskeletal: Normal range of motion and neck supple.  Cardiovascular:     Rate and Rhythm: Normal rate and regular rhythm.     Heart sounds: Normal heart sounds.  Pulmonary:     Effort: Pulmonary effort is normal. No respiratory distress.     Breath sounds: No wheezing.  Abdominal:     General: Bowel sounds are normal. There is no distension.     Palpations: Abdomen is soft. There is no mass.     Tenderness: There is no abdominal tenderness.  Musculoskeletal: Normal range of motion.        General: No deformity.  Skin:    General: Skin is warm and dry.     Findings: No erythema or rash.  Neurological:     Mental Status: She is alert and oriented to person, place, and time.     Cranial Nerves: No cranial nerve deficit.     Coordination: Coordination normal.  Psychiatric:        Behavior: Behavior normal.        Thought Content: Thought content normal.      LABORATORY DATA:  I have reviewed the data as listed Lab Results  Component Value Date   WBC 5.8 11/07/2018   HGB 10.7 (L) 11/07/2018   HCT 33.8 (L) 11/07/2018   MCV 88.0 11/07/2018   PLT 125 (L) 11/07/2018   Recent Labs    09/20/18 1315  09/22/18 0427 09/23/18 0720 09/24/18 0600  NA 134*   < > 135 133* 131*  K 3.1*    < > 4.6 4.3 4.1  CL 97*   < > 106 103 100  CO2 25   < > 20* 23 24  GLUCOSE 120*   < > 118* 102* 98  BUN 34*   < > 24* 18 16  CREATININE 1.79*   < > 1.33* 1.42* 1.39*  CALCIUM 8.3*   < > 7.9* 8.3* 8.5*  GFRNONAA 30*   < > 42* 39* 40*  GFRAA 34*   < > 49* 45* 47*  PROT 7.4  --   --   --  6.3*  ALBUMIN 3.5  --   --   --  2.8*  AST 17  --   --   --  43*  ALT 17  --   --   --  49*  ALKPHOS 57  --   --   --  74  BILITOT 1.7*  --   --   --  0.8   < > = values in this interval not displayed.   Iron/TIBC/Ferritin/ %Sat    Component Value Date/Time   IRON 57 11/07/2018 1130   TIBC 288 11/07/2018 1130   FERRITIN 78 11/07/2018 1130   IRONPCTSAT 20 11/07/2018 1130    09/22/2018 folate 46, vitamin B12 305 09/21/2018, HIV negative.  RADIOGRAPHIC STUDIES: I have personally reviewed the radiological images as listed and agreed with the findings in the report. Ct Abdomen Pelvis Wo Contrast  Result Date: 09/20/2018 CLINICAL DATA:  Abdominal pain. EXAM: CT ABDOMEN AND PELVIS WITHOUT CONTRAST TECHNIQUE: Multidetector CT imaging of the abdomen and pelvis was performed following the standard protocol without IV contrast. COMPARISON:  03/11/2009 FINDINGS: Lower chest: No acute abnormality. Hepatobiliary: Polycystic liver is again identified. There are innumerable fluid density cysts identified throughout both lobes of the liver. The dominant cyst arises from the posterior aspect of the lateral segment of left lobe of liver measuring 7.4  cm. Previously this cyst measured 4.6 cm. Moderate intrahepatic and common bile duct dilatation is again noted as seen on 03/11/2009. The common bile duct measures up to 1.4 cm. No calcified stones identified within the common bile duct. Pancreas: Unremarkable. No pancreatic ductal dilatation or surrounding inflammatory changes. Spleen: Normal in size without focal abnormality. Adrenals/Urinary Tract: Adrenal glands are difficult to identified. Bilateral polycystic kidneys are  again noted. There are innumerable bilateral cystic kidney lesions throughout the enlarged kidneys. Since the previous exam multiple new scattered calcifications are associated with many of the cysts. A number of cysts are complicated by hemorrhage and appear hyperdense. All these cysts are incompletely characterized without IV contrast and underlying cystic neoplasm or solid enhancing neoplasm would be difficult to exclude given lack of IV contrast material. No definite hydronephrosis identified bilaterally. There is been progressive enlargement of the right kidney which measures 13.6 x 7.9 by 11.9 cm (volume = 670 cm^3). Previously 9.1 x 5.2 by 10.0 cm (volume = 250 cm^3) Urinary bladder is unremarkable. Stomach/Bowel: Stomach is within normal limits. Enlarged right kidney has mass effect upon right abdominal bowel loops and displaces the ascending colon anteriorly. No evidence of bowel wall thickening, distention, or inflammatory changes. Numerous distal colonic diverticula identified without acute inflammation. Vascular/Lymphatic: Aortic atherosclerosis without aneurysm. No abdominopelvic adenopathy identified. Reproductive: Status post hysterectomy. No adnexal masses. Other: Trace free fluid identified within the pelvis. Musculoskeletal: No acute or significant osseous findings. IMPRESSION: 1. Innumerable kidney and liver cysts consistent with autosomal dominant polycystic kidney disease. 2. There is progressive right nephromegaly when compared with 03/11/2009. Right kidney has a volume of approximately 670 cc compared with 250 cc previously. The enlarged right kidney has mass effect and displaces the right abdominal bowel loops. 3.  Aortic Atherosclerosis (ICD10-I70.0). Electronically Signed   By: Kerby Moors M.D.   On: 09/20/2018 15:32   Dg Chest 1 View  Result Date: 09/23/2018 CLINICAL DATA:  Admitted recently for food poisoning.  Cough. EXAM: CHEST  1 VIEW COMPARISON:  09/20/2018 FINDINGS: Lungs are  adequately inflated and otherwise clear. Cardiomediastinal silhouette and remainder of the exam is unchanged. IMPRESSION: No active disease. Electronically Signed   By: Marin Olp M.D.   On: 09/23/2018 11:04   Dg Chest Portable 1 View  Result Date: 09/20/2018 CLINICAL DATA:  Pain after her abdominal hernia. Watery diarrhea with nausea and vomiting past 3 days. Fever. EXAM: PORTABLE CHEST 1 VIEW COMPARISON:  06/16/2010 FINDINGS: Patient slightly rotated to the left. Lungs are adequately inflated without airspace consolidation or effusion. Cardiomediastinal silhouette and remainder of the exam is unchanged. IMPRESSION: No acute findings. Electronically Signed   By: Marin Olp M.D.   On: 09/20/2018 16:49   ASSESSMENT & PLAN:  1. Anemia due to chronic kidney disease, unspecified CKD stage   2. Thrombocytopenia ([place])   3. B12 deficiency   Labs are reviewed and discussed with patient. Anemia and thrombocytopenia work-up showed normal TSH, negative hepatitis B surface antigen and hepatitis C antibody. Multiple myeloma panel nonremarkable. Iron and TIBC ferritin were not consistent with iron deficiency. Normal immature platelet fraction, Normal haptoglobin and LDH. Blood smear showed teardrop cells, ovalocytes.  Discussed with patient that her hemoglobin has improved since her discharge from the hospital.  Acute on chronic anemia may be secondary to recent infection.  She had blood work in 2012 with a hemoglobin level of 10.7, hemoglobin in 2016 was 12.5, and 11.5 in February 2018. She may have chronic anemia secondary to chronic  kidney disease. Discussed with patient about a bone marrow biopsy to rule out underlying bone marrow malignancy. Patient used to continue to observe current blood count.  She reports feeling well.  No constitutional symptoms. She will have short-term follow-up with repeat blood work and MD assessment. If hemoglobin get worse or not normalized.  Recommend proceed with  bone marrow biopsy at that time.  Vitamin B12 deficiency, recommend patient to continue vitamin B12 injections.  Primary care physician.  Will recheck B12 level at next visit.  Orders Placed This Encounter  Procedures   CBC with Differential/Platelet    Standing Status:   Future    Standing Expiration Date:   11/21/2019   Comprehensive metabolic panel    Standing Status:   Future    Standing Expiration Date:   11/21/2019   Vitamin B12    Standing Status:   Future    Standing Expiration Date:   03/23/2019    All questions were answered. The patient knows to call the clinic with any problems questions or concerns. Leron Croak, MD  Return of visit: 6 weeks We spent sufficient time to discuss many aspect of care, questions were answered to patient's satisfaction. Total face to face encounter time for this patient visit was 25 min. >50% of the time was  spent in counseling and coordination of care.    Earlie Server, MD, PhD 11/21/2018

## 2018-11-28 ENCOUNTER — Other Ambulatory Visit
Admission: RE | Admit: 2018-11-28 | Discharge: 2018-11-28 | Disposition: A | Discharge status: Home / Self Care | Payer: BC Managed Care – PPO | Source: Ambulatory Visit | Attending: Gastroenterology | Admitting: Gastroenterology

## 2018-11-28 ENCOUNTER — Other Ambulatory Visit: Payer: Self-pay

## 2018-11-28 DIAGNOSIS — Z01812 Encounter for preprocedural laboratory examination: Secondary | ICD-10-CM | POA: Diagnosis not present

## 2018-11-28 DIAGNOSIS — Z20828 Contact with and (suspected) exposure to other viral communicable diseases: Secondary | ICD-10-CM | POA: Insufficient documentation

## 2018-11-28 LAB — SARS CORONAVIRUS 2 (TAT 6-24 HRS): SARS Coronavirus 2: NEGATIVE

## 2018-12-01 ENCOUNTER — Encounter
Admission: RE | Disposition: A | Discharge status: Home / Self Care | Payer: Self-pay | Source: Home / Self Care | Attending: Gastroenterology

## 2018-12-01 ENCOUNTER — Ambulatory Visit: Payer: BC Managed Care – PPO | Admitting: Anesthesiology

## 2018-12-01 ENCOUNTER — Other Ambulatory Visit: Payer: Self-pay

## 2018-12-01 ENCOUNTER — Encounter: Payer: Self-pay | Admitting: Anesthesiology

## 2018-12-01 ENCOUNTER — Ambulatory Visit
Admission: RE | Admit: 2018-12-01 | Discharge: 2018-12-01 | Disposition: A | Discharge status: Home / Self Care | Payer: BC Managed Care – PPO | Attending: Gastroenterology | Admitting: Gastroenterology

## 2018-12-01 DIAGNOSIS — N289 Disorder of kidney and ureter, unspecified: Secondary | ICD-10-CM | POA: Insufficient documentation

## 2018-12-01 DIAGNOSIS — M810 Age-related osteoporosis without current pathological fracture: Secondary | ICD-10-CM | POA: Diagnosis not present

## 2018-12-01 DIAGNOSIS — Z881 Allergy status to other antibiotic agents status: Secondary | ICD-10-CM | POA: Insufficient documentation

## 2018-12-01 DIAGNOSIS — D509 Iron deficiency anemia, unspecified: Secondary | ICD-10-CM | POA: Diagnosis present

## 2018-12-01 DIAGNOSIS — Z8249 Family history of ischemic heart disease and other diseases of the circulatory system: Secondary | ICD-10-CM | POA: Insufficient documentation

## 2018-12-01 DIAGNOSIS — Z90710 Acquired absence of both cervix and uterus: Secondary | ICD-10-CM | POA: Diagnosis not present

## 2018-12-01 DIAGNOSIS — D500 Iron deficiency anemia secondary to blood loss (chronic): Secondary | ICD-10-CM | POA: Insufficient documentation

## 2018-12-01 DIAGNOSIS — Z888 Allergy status to other drugs, medicaments and biological substances status: Secondary | ICD-10-CM | POA: Insufficient documentation

## 2018-12-01 DIAGNOSIS — D649 Anemia, unspecified: Secondary | ICD-10-CM

## 2018-12-01 DIAGNOSIS — Z9049 Acquired absence of other specified parts of digestive tract: Secondary | ICD-10-CM | POA: Diagnosis not present

## 2018-12-01 DIAGNOSIS — Z79899 Other long term (current) drug therapy: Secondary | ICD-10-CM | POA: Diagnosis not present

## 2018-12-01 HISTORY — PX: ESOPHAGOGASTRODUODENOSCOPY (EGD) WITH PROPOFOL: SHX5813

## 2018-12-01 SURGERY — ESOPHAGOGASTRODUODENOSCOPY (EGD) WITH PROPOFOL
Anesthesia: General

## 2018-12-01 MED ORDER — PROPOFOL 10 MG/ML IV BOLUS
INTRAVENOUS | Status: DC | PRN
  Administered 2018-12-01: 50 mg via INTRAVENOUS

## 2018-12-01 MED ORDER — SODIUM CHLORIDE 0.9 % IV SOLN
INTRAVENOUS | Status: DC
  Administered 2018-12-01: 09:00:00 via INTRAVENOUS
  Administered 2018-12-01: 09:00:00 1000 mL via INTRAVENOUS

## 2018-12-01 MED ORDER — PROPOFOL 500 MG/50ML IV EMUL
INTRAVENOUS | Status: DC | PRN
  Administered 2018-12-01: 125 ug/kg/min via INTRAVENOUS

## 2018-12-01 MED ORDER — PROPOFOL 500 MG/50ML IV EMUL
INTRAVENOUS | Status: AC
  Filled 2018-12-01: qty 50

## 2018-12-01 MED ORDER — LIDOCAINE HCL (PF) 2 % IJ SOLN
INTRAMUSCULAR | Status: DC | PRN
  Administered 2018-12-01: 100 mg via INTRADERMAL

## 2018-12-01 NOTE — Anesthesia Postprocedure Evaluation (Signed)
Anesthesia Post Note  Patient: Jamie Foley  Procedure(s) Performed: ESOPHAGOGASTRODUODENOSCOPY (EGD) WITH PROPOFOL (N/A )  Patient location during evaluation: Endoscopy Anesthesia Type: General Level of consciousness: awake and alert Pain management: pain level controlled Vital Signs Assessment: post-procedure vital signs reviewed and stable Respiratory status: spontaneous breathing, nonlabored ventilation, respiratory function stable and patient connected to nasal cannula oxygen Cardiovascular status: blood pressure returned to baseline and stable Postop Assessment: no apparent nausea or vomiting Anesthetic complications: no     Last Vitals:  Vitals:   12/01/18 0903 12/01/18 0927  BP: (!) 150/104 (!) 150/94  Pulse:    Resp:    Temp:    SpO2: 100%     Last Pain:  Vitals:   12/01/18 0903  TempSrc:   PainSc: 0-No pain                 Martha Clan

## 2018-12-01 NOTE — Anesthesia Post-op Follow-up Note (Signed)
Anesthesia QCDR form completed.        

## 2018-12-01 NOTE — Transfer of Care (Signed)
Immediate Anesthesia Transfer of Care Note  Patient: Jamie Foley  Procedure(s) Performed: ESOPHAGOGASTRODUODENOSCOPY (EGD) WITH PROPOFOL (N/A )  Patient Location: PACU  Anesthesia Type:General  Level of Consciousness: awake, alert  and oriented  Airway & Oxygen Therapy: Patient Spontanous Breathing and Patient connected to nasal cannula oxygen  Post-op Assessment: Report given to RN and Post -op Vital signs reviewed and stable  Post vital signs: Reviewed and stable  Last Vitals:  Vitals Value Taken Time  BP    Temp 36.1 C 12/01/18 0853  Pulse 78 12/01/18 0853  Resp    SpO2 100 % 12/01/18 0853    Last Pain:  Vitals:   12/01/18 0853  TempSrc: Tympanic  PainSc: 0-No pain         Complications: No apparent anesthesia complications

## 2018-12-01 NOTE — H&P (Signed)
Arlyss Repressohini R Eisa Necaise, MD [address]  [address]  [place], [place] [address]  Main: [phone]  Fax: [phone] Pager: [phone]  Primary Care Physician:  Jerl MinaHedrick, James, MD Primary Gastroenterologist:  Dr. Arlyss Repressohini R Hever Castilleja  Pre-Procedure History & Physical: HPI:  Jamie Foley is a 63 y.o. female is here for an endoscopy.   Past Medical History:  Diagnosis Date  . Anemia   . Arthritis   . Complication of anesthesia    HARD TO WAKE UP AFTER ANKLE SURGERY-FELT LIKE SHE RECEIVED TO MUCH ANESTHESIA  . Osteoporosis   . Polycystic kidney disease   . Renal insufficiency     Past Surgical History:  Procedure Laterality Date  . ABDOMINAL HYSTERECTOMY    . CATARACT EXTRACTION W/PHACO Left 10/14/2014   Procedure: CATARACT EXTRACTION PHACO AND INTRAOCULAR LENS PLACEMENT ([facility]);  Surgeon: Sallee LangeSteven Dingeldein, MD;  Location: [facility] OR[facility];  Service: Ophthalmology;  Laterality: Left;  [place] 01:09 AP% 60.0 CDE 37.21 fluid pack lot #[id number]  . CHOLECYSTECTOMY    . EYE SURGERY Left    DETACHED RETINA   . HERNIA REPAIR    . INGUINAL HERNIA REPAIR Left 08/03/2016   Procedure: HERNIA REPAIR INGUINAL ADULT;  Surgeon: Nadeen LandauSmith, Jarvis Wilton, MD;  Location: [facility] OR[facility];  Service: General;  Laterality: Left;  . right ankle fracture Right   . VENTRAL HERNIA REPAIR N/A 08/03/2016   Procedure: HERNIA REPAIR VENTRAL ADULT;  Surgeon: Nadeen LandauSmith, Jarvis Wilton, MD;  Location: [facility] OR[facility];  Service: General;  Laterality: N/A;    Prior to Admission medications   Medication Sig Start Date End Date Taking? Authorizing Provider  bisacodyl (DULCOLAX) 10 MG suppository Place rectally. 09/27/18   [provider]  loperamide (IMODIUM) 2 MG capsule Take 1 capsule (2 mg total) by mouth as needed for diarrhea or loose stools. 09/24/18   Mayo, Allyn KennerKaty Dodd, MD  ondansetron (ZOFRAN) 4 MG tablet Take 1 tablet (4 mg total) by mouth every 6 (six) hours as needed for nausea. 09/24/18   Mayo, Allyn KennerKaty Dodd, MD  senna-docusate  (SENOKOT-S) 8.6-50 MG tablet Take by mouth. 09/27/18   [provider]    Allergies as of 11/03/2018 - Review Complete 11/02/2018  Allergen Reaction Noted  . Augmentin [amoxicillin-pot clavulanate] Nausea Only and Other (See Comments) 10/11/2014  . Levofloxacin Other (See Comments) 05/24/2016  . Prednisone Nausea And Vomiting and Other (See Comments) 07/21/2016    Family History  Problem Relation Age of Onset  . Heart attack Father 6670    Social History   Socioeconomic History  . Marital status: Legally Separated    Spouse name: Not on file  . Number of children: Not on file  . Years of education: Not on file  . Highest education level: Not on file  Occupational History  . Not on file  Social Needs  . Financial resource strain: Not on file  . Food insecurity    Worry: Not on file    Inability: Not on file  . Transportation needs    Medical: Not on file    Non-medical: Not on file  Tobacco Use  . Smoking status: Never Smoker  . Smokeless tobacco: Never Used  Substance and Sexual Activity  . Alcohol use: No  . Drug use: No  . Sexual activity: Not on file  Lifestyle  . Physical activity    Days per week: Not on file    Minutes per session: Not on file  . Stress: Not on file  Relationships  . Social  connections    Talks on phone: Not on file    Gets together: Not on file    Attends religious service: Not on file    Active member of club or organization: Not on file    Attends meetings of clubs or organizations: Not on file    Relationship status: Not on file  . Intimate partner violence    Fear of current or ex partner: Not on file    Emotionally abused: Not on file    Physically abused: Not on file    Forced sexual activity: Not on file  Other Topics Concern  . Not on file  Social History Narrative  . Not on file    Review of Systems: See HPI, otherwise negative ROS  Physical Exam: BP (!) 159/101   Pulse 76   Resp 18   Ht 5\' 5"  (1.651 m)    Wt 45.4 kg   SpO2 100%   BMI 16.64 kg/m  General:   Alert,  pleasant and cooperative in NAD Head:  Normocephalic and atraumatic. Neck:  Supple; no masses or thyromegaly. Lungs:  Clear throughout to auscultation.    Heart:  Regular rate and rhythm. Abdomen:  Soft, nontender and nondistended. Normal bowel sounds, without guarding, and without rebound.   Neurologic:  Alert and  oriented x4;  grossly normal neurologically.  Impression/Plan: PRINCES FINGER is here for an endoscopy to be performed for B12 deficiency anemia  Risks, benefits, limitations, and alternatives regarding  endoscopy have been reviewed with the patient.  Questions have been answered.  All parties agreeable.   Sherri Sear, MD  12/01/2018, 8:35 AM

## 2018-12-01 NOTE — Anesthesia Preprocedure Evaluation (Signed)
Anesthesia Evaluation  Patient identified by MRN, date of birth, ID band Patient awake    Reviewed: Allergy & Precautions, NPO status , Patient's Chart, lab work & pertinent test results  History of Anesthesia Complications (+) PONV, PROLONGED EMERGENCE and history of anesthetic complications  Airway Mallampati: I  TM Distance: <3 FB     Dental  (+) Dental Advidsory Given, Teeth Intact   Pulmonary neg pulmonary ROS,           Cardiovascular negative cardio ROS       Neuro/Psych    GI/Hepatic negative GI ROS, Neg liver ROS,   Endo/Other  negative endocrine ROS  Renal/GU Renal disease (polycystic kidney dz)     Musculoskeletal   Abdominal   Peds  Hematology  (+) Blood dyscrasia, anemia ,   Anesthesia Other Findings Past Medical History: No date: Anemia No date: Arthritis No date: Complication of anesthesia     Comment:  HARD TO WAKE UP AFTER ANKLE SURGERY-FELT LIKE SHE               RECEIVED TO MUCH ANESTHESIA No date: Osteoporosis No date: Polycystic kidney disease No date: Renal insufficiency   Reproductive/Obstetrics                             Anesthesia Physical  Anesthesia Plan  ASA: II  Anesthesia Plan: General   Post-op Pain Management:    Induction: Intravenous  PONV Risk Score and Plan: 4 or greater and Propofol infusion and TIVA  Airway Management Planned: Natural Airway and Nasal Cannula  Additional Equipment:   Intra-op Plan:   Post-operative Plan:   Informed Consent: I have reviewed the patients History and Physical, chart, labs and discussed the procedure including the risks, benefits and alternatives for the proposed anesthesia with the patient or authorized representative who has indicated his/her understanding and acceptance.       Plan Discussed with:   Anesthesia Plan Comments:         Anesthesia Quick Evaluation

## 2018-12-01 NOTE — Op Note (Signed)
[facility] Patient Name: Jamie Foley Procedure Date: 12/01/2018 8:33 AM MRN: [id number] Account #: [id number] Date of Birth: [birth year] Admit Type: Outpatient Age: 63 Room: [facility] ENDO ROOM 1 Gender: Female Note Status: Finalized Procedure:            Upper GI endoscopy Indications:          Iron deficiency anemia secondary to chronic blood loss,                        B12 deficiency anemia Providers:            Lin Landsman MD, MD Medicines:            Monitored Anesthesia Care Complications:        No immediate complications. Estimated blood loss: None. Procedure:            Pre-Anesthesia Assessment:                       - Prior to the procedure, a History and Physical was                        performed, and patient medications and allergies were                        reviewed. The patient is competent. The risks and                        benefits of the procedure and the sedation options and                        risks were discussed with the patient. All questions                        were answered and informed consent was obtained.                        Patient identification and proposed procedure were                        verified by the physician, the nurse, the                        anesthesiologist, the anesthetist and the technician in                        the pre-procedure area in the procedure room in the                        endoscopy suite. Mental Status Examination: alert and                        oriented. Airway Examination: normal oropharyngeal                        airway and neck mobility. Respiratory Examination:                        clear to auscultation. CV Examination: normal.  Prophylactic Antibiotics: The patient does not require                        prophylactic antibiotics. Prior Anticoagulants: The                        patient has taken no previous anticoagulant  or                        antiplatelet agents. ASA Grade Assessment: III - A                        patient with severe systemic disease. After reviewing                        the risks and benefits, the patient was deemed in                        satisfactory condition to undergo the procedure. The                        anesthesia plan was to use monitored anesthesia care                        (MAC). Immediately prior to administration of                        medications, the patient was re-assessed for adequacy                        to receive sedatives. The heart rate, respiratory rate,                        oxygen saturations, blood pressure, adequacy of                        pulmonary ventilation, and response to care were                        monitored throughout the procedure. The physical status                        of the patient was re-assessed after the procedure.                       After obtaining informed consent, the endoscope was                        passed under direct vision. Throughout the procedure,                        the patient's blood pressure, pulse, and oxygen                        saturations were monitored continuously. The Endoscope                        was introduced through the mouth, and advanced to the  second part of duodenum. The upper GI endoscopy was                        accomplished without difficulty. The patient tolerated                        the procedure well. Findings:      The duodenal bulb and second portion of the duodenum were normal.       Biopsies for histology were taken with a cold forceps for evaluation of       celiac disease.      The entire examined stomach was normal. Biopsies were taken with a cold       forceps for Helicobacter pylori testing.      The cardia and gastric fundus were normal on retroflexion.      The gastroesophageal junction and examined esophagus were normal.       Esophagogastric landmarks were identified: the gastroesophageal junction       was found at 39 cm from the incisors. Impression:           - Normal duodenal bulb and second portion of the                        duodenum. Biopsied.                       - Normal stomach. Biopsied.                       - Normal gastroesophageal junction and esophagus.                       - Esophagogastric landmarks identified. Recommendation:       - Await pathology results.                       - Discharge patient to home (with escort).                       - Resume regular diet today.                       - Continue present medications. Procedure Code(s):    --- Professional ---                       [phone], Esophagogastroduodenoscopy, flexible, transoral;                        with biopsy, single or multiple Diagnosis Code(s):    --- Professional ---                       D50.0, Iron deficiency anemia secondary to blood loss                        (chronic) CPT copyright 2019 American Medical Association. All rights reserved. The codes documented in this report are preliminary and upon coder review may  be revised to meet current compliance requirements. Dr. Ulyess Mort Lin Landsman MD, MD 12/01/2018 8:51:22 AM This report has been signed electronically. Number of Addenda: 0 Note Initiated On: 12/01/2018 8:33 AM Estimated Blood Loss: Estimated blood loss: none.  [facility]

## 2018-12-01 NOTE — Anesthesia Procedure Notes (Signed)
Date/Time: 12/01/2018 8:44 AM Performed by: Nelda Marseille, CRNA Pre-anesthesia Checklist: Patient identified, Emergency Drugs available, Suction available, Patient being monitored and Timeout performed Oxygen Delivery Method: Nasal cannula

## 2018-12-04 LAB — SURGICAL PATHOLOGY

## 2018-12-06 ENCOUNTER — Other Ambulatory Visit: Payer: Self-pay

## 2018-12-06 DIAGNOSIS — A048 Other specified bacterial intestinal infections: Secondary | ICD-10-CM

## 2018-12-06 MED ORDER — OMEPRAZOLE 40 MG PO CPDR: 40.0000 mg | DELAYED_RELEASE_CAPSULE | Freq: Two times a day (BID) | ORAL | 0 refills | Status: DC

## 2018-12-06 MED ORDER — CLARITHROMYCIN 500 MG PO TABS: 500.0000 mg | ORAL_TABLET | Freq: Two times a day (BID) | ORAL | 0 refills | Status: AC

## 2018-12-06 MED ORDER — AMOXICILLIN 500 MG PO TABS: 1000.0000 mg | ORAL_TABLET | Freq: Two times a day (BID) | ORAL | 0 refills | Status: AC

## 2019-01-01 ENCOUNTER — Other Ambulatory Visit: Payer: Self-pay

## 2019-01-02 ENCOUNTER — Other Ambulatory Visit: Payer: Self-pay

## 2019-01-02 ENCOUNTER — Inpatient Hospital Stay: Payer: BC Managed Care – PPO | Attending: Oncology

## 2019-01-02 DIAGNOSIS — D696 Thrombocytopenia, unspecified: Secondary | ICD-10-CM | POA: Diagnosis not present

## 2019-01-02 DIAGNOSIS — D631 Anemia in chronic kidney disease: Secondary | ICD-10-CM | POA: Diagnosis present

## 2019-01-02 DIAGNOSIS — E538 Deficiency of other specified B group vitamins: Secondary | ICD-10-CM

## 2019-01-02 DIAGNOSIS — N189 Chronic kidney disease, unspecified: Secondary | ICD-10-CM | POA: Diagnosis present

## 2019-01-02 LAB — CBC WITH DIFFERENTIAL/PLATELET
Abs Immature Granulocytes: 0.01 10*3/uL (ref 0.00–0.07)
Basophils Absolute: 0.1 10*3/uL (ref 0.0–0.1)
Basophils Relative: 2 %
Eosinophils Absolute: 0.2 10*3/uL (ref 0.0–0.5)
Eosinophils Relative: 5 %
HCT: 32.1 % — ABNORMAL LOW (ref 36.0–46.0)
Hemoglobin: 10.3 g/dL — ABNORMAL LOW (ref 12.0–15.0)
Immature Granulocytes: 0 %
Lymphocytes Relative: 18 %
Lymphs Abs: 1 10*3/uL (ref 0.7–4.0)
MCH: 28 pg (ref 26.0–34.0)
MCHC: 32.1 g/dL (ref 30.0–36.0)
MCV: 87.2 fL (ref 80.0–100.0)
Monocytes Absolute: 0.4 10*3/uL (ref 0.1–1.0)
Monocytes Relative: 8 %
Neutro Abs: 3.5 10*3/uL (ref 1.7–7.7)
Neutrophils Relative %: 67 %
Platelets: 107 10*3/uL — ABNORMAL LOW (ref 150–400)
RBC: 3.68 MIL/uL — ABNORMAL LOW (ref 3.87–5.11)
RDW: 13.3 % (ref 11.5–15.5)
WBC: 5.2 10*3/uL (ref 4.0–10.5)
nRBC: 0 % (ref 0.0–0.2)

## 2019-01-02 LAB — COMPREHENSIVE METABOLIC PANEL
ALT: 13 U/L (ref 0–44)
AST: 17 U/L (ref 15–41)
Albumin: 3.9 g/dL (ref 3.5–5.0)
Alkaline Phosphatase: 44 U/L (ref 38–126)
Anion gap: 8 (ref 5–15)
BUN: 34 mg/dL — ABNORMAL HIGH (ref 8–23)
CO2: 27 mmol/L (ref 22–32)
Calcium: 8.9 mg/dL (ref 8.9–10.3)
Chloride: 104 mmol/L (ref 98–111)
Creatinine, Ser: 1.66 mg/dL — ABNORMAL HIGH (ref 0.44–1.00)
GFR calc Af Amer: 38 mL/min — ABNORMAL LOW (ref >60)
GFR calc non Af Amer: 32 mL/min — ABNORMAL LOW (ref >60)
Glucose, Bld: 93 mg/dL (ref 70–99)
Potassium: 4.9 mmol/L (ref 3.5–5.1)
Sodium: 139 mmol/L (ref 135–145)
Total Bilirubin: 0.6 mg/dL (ref 0.3–1.2)
Total Protein: 7 g/dL (ref 6.5–8.1)

## 2019-01-02 LAB — VITAMIN B12: Vitamin B-12: 287 pg/mL (ref 180–914)

## 2019-01-02 LAB — TECHNOLOGIST SMEAR REVIEW: Plt Morphology: NORMAL

## 2019-01-03 ENCOUNTER — Other Ambulatory Visit: Payer: Self-pay

## 2019-01-03 ENCOUNTER — Encounter: Payer: Self-pay | Admitting: Oncology

## 2019-01-03 NOTE — Progress Notes (Signed)
Pre-visit assessment call completed prior to [place] appointment with Dr. Tasia Catchings on 01/04/2019.  Concerns: none  Note: Pt reports that she only took one antibiotic along with omeprazole for her H. Pylori infection. She felt that taking two antibiotics was too much for her sensitive stomach. I was unable to determine which one she actually took. She said that she took the one that was 500mg  twice per day and said that it was "Amoxil." However, this contradicts her orders. Further clarification is needed.  Her orders are as follows:  Omeprazole 40mg  BID   Clarithromycin 500mg  BID   Amoxicillin 1gm BID   It looks like a repeat H. Pylori test was ordered and supposed to be completed 2 weeks after antibiotics were finished (~ 12/28/18).  Pt has not had this done yet.

## 2019-01-04 ENCOUNTER — Inpatient Hospital Stay: Payer: BC Managed Care – PPO | Attending: Oncology | Admitting: Oncology

## 2019-01-04 ENCOUNTER — Other Ambulatory Visit: Payer: Self-pay

## 2019-01-04 VITALS — BP 172/99 | Temp 96.5°F | Resp 16 | Wt 103.0 lb

## 2019-01-04 DIAGNOSIS — Q612 Polycystic kidney, adult type: Secondary | ICD-10-CM | POA: Insufficient documentation

## 2019-01-04 DIAGNOSIS — N1832 Chronic kidney disease, stage 3b: Secondary | ICD-10-CM

## 2019-01-04 DIAGNOSIS — Z90710 Acquired absence of both cervix and uterus: Secondary | ICD-10-CM | POA: Insufficient documentation

## 2019-01-04 DIAGNOSIS — Z8249 Family history of ischemic heart disease and other diseases of the circulatory system: Secondary | ICD-10-CM | POA: Diagnosis not present

## 2019-01-04 DIAGNOSIS — D631 Anemia in chronic kidney disease: Secondary | ICD-10-CM | POA: Insufficient documentation

## 2019-01-04 DIAGNOSIS — E538 Deficiency of other specified B group vitamins: Secondary | ICD-10-CM

## 2019-01-04 DIAGNOSIS — Z79899 Other long term (current) drug therapy: Secondary | ICD-10-CM | POA: Insufficient documentation

## 2019-01-04 DIAGNOSIS — D696 Thrombocytopenia, unspecified: Secondary | ICD-10-CM | POA: Diagnosis not present

## 2019-01-04 NOTE — Progress Notes (Signed)
Patient was diagnosed with H. Pylori by GI.  Advised her to contact GI office with any concerns or problems pertaining to GI issues.  She denies any concerns today.

## 2019-01-05 NOTE — Addendum Note (Signed)
Addended by: Earlie Server on: 01/05/2019 11:50 PM   Modules accepted: Orders

## 2019-01-05 NOTE — Progress Notes (Signed)
Hematology/Oncology follow up note [facility] [phone] [phone]   Patient Care Team: Maryland Pink, MD as PCP - General (Family Medicine) [place] Pink, MD as Consulting Physician (Family Medicine) Christene Lye, MD (General Surgery)  REFERRING PROVIDER: Maryland Pink, MD  REASON FOR VISIT:  Follow-up for anemia  HISTORY OF PRESENTING ILLNESS:  Jamie Foley is a  63 y.o.  female with PMH listed below who was referred to me for evaluation of anemia Reviewed patient's recent labs that was done.  09/24/2018 Labs revealed anemia with hemoglobin of 9.2, platelet 106,000, Patient was recently admitted from 09/19/2020 09/24/2018 due to nausea, vomiting, diarrhea, secondary to gastroenteritis. CT abdomen pelvis showed no acute abnormalities.  C. difficile negative.  GI pathogens panel negative.  Was seen and evaluated by gastroenterology, felt patient had food poisoning. She also had AKI due to dehydration.  Improved after IV fluids. She had a history of autosomal dominant polycystic kidney disease, she has been following up with nephrologist at [facility].  She had a follow-up appointment with Dr. Lin Landsman on 11/02/2019 patient was referred to heme-onc to Establish care for evaluation of anemia.  Associated signs and symptoms: Patient reports fatigue.  SOB with exertion.  Denies weight loss, easy bruising, hematochezia, hemoptysis, hematuria. Context: History of GI bleeding: Denies               History of Chronic kidney disease; history of polycystic kidney disease.                               Last colonoscopy: Reports that she had colonoscopy about 1 to 2 years ago at outside facility.  INTERVAL HISTORY Jamie Foley is a 63 y.o. female who has above history reviewed by me today presents for follow up visit for management of anemia. Problems and complaints are listed below: Reports clinically doing well. Feels ok  today.  No new compliants.     Review of Systems  Constitutional: Positive for fatigue. Negative for appetite change, chills and fever.  HENT:   Negative for hearing loss and voice change.   Eyes: Negative for eye problems.  Respiratory: Negative for chest tightness and cough.   Cardiovascular: Negative for chest pain.  Gastrointestinal: Negative for abdominal distention, abdominal pain and blood in stool.  Endocrine: Negative for hot flashes.  Genitourinary: Negative for difficulty urinating and frequency.   Musculoskeletal: Negative for arthralgias.  Skin: Negative for itching and rash.  Neurological: Negative for extremity weakness.  Hematological: Negative for adenopathy.  Psychiatric/Behavioral: Negative for confusion.     MEDICAL HISTORY:  Past Medical History:  Diagnosis Date  . Anemia   . Arthritis   . Complication of anesthesia    HARD TO WAKE UP AFTER ANKLE SURGERY-FELT LIKE SHE RECEIVED TO MUCH ANESTHESIA  . Osteoporosis   . Polycystic kidney disease   . Renal insufficiency     SURGICAL HISTORY: Past Surgical History:  Procedure Laterality Date  . ABDOMINAL HYSTERECTOMY    . CATARACT EXTRACTION W/PHACO Left 10/14/2014   Procedure: CATARACT EXTRACTION PHACO AND INTRAOCULAR LENS PLACEMENT (IOC);  Surgeon: Estill Cotta, MD;  Location: [facility] ORS;  Service: Ophthalmology;  Laterality: Left;  [place] 01:09 AP% 60.0 CDE 37.21 fluid pack lot #[id number]  . CHOLECYSTECTOMY    . ESOPHAGOGASTRODUODENOSCOPY (EGD) WITH PROPOFOL N/A 12/01/2018   Procedure: ESOPHAGOGASTRODUODENOSCOPY (EGD) WITH PROPOFOL;  Surgeon: Lin Landsman, MD;  Location: [facility];  Service: Gastroenterology;  Laterality: N/A;  . EYE SURGERY Left    DETACHED RETINA   . HERNIA REPAIR    . INGUINAL HERNIA REPAIR Left 08/03/2016   Procedure: HERNIA REPAIR INGUINAL ADULT;  Surgeon: Leonie Green, MD;  Location: [facility] ORS;  Service: General;  Laterality: Left;  . right ankle fracture Right    . VENTRAL HERNIA REPAIR N/A 08/03/2016   Procedure: HERNIA REPAIR VENTRAL ADULT;  Surgeon: Leonie Green, MD;  Location: [facility] ORS;  Service: General;  Laterality: N/A;    SOCIAL HISTORY: Social History   Socioeconomic History  . Marital status: Legally Separated    Spouse name: Not on file  . Number of children: Not on file  . Years of education: Not on file  . Highest education level: Not on file  Occupational History  . Not on file  Social Needs  . Financial resource strain: Not on file  . Food insecurity    Worry: Not on file    Inability: Not on file  . Transportation needs    Medical: Not on file    Non-medical: Not on file  Tobacco Use  . Smoking status: Never Smoker  . Smokeless tobacco: Never Used  Substance and Sexual Activity  . Alcohol use: No  . Drug use: No  . Sexual activity: Not on file  Lifestyle  . Physical activity    Days per week: Not on file    Minutes per session: Not on file  . Stress: Not on file  Relationships  . Social Herbalist on phone: Not on file    Gets together: Not on file    Attends religious service: Not on file    Active member of club or organization: Not on file    Attends meetings of clubs or organizations: Not on file    Relationship status: Not on file  . Intimate partner violence    Fear of current or ex partner: Not on file    Emotionally abused: Not on file    Physically abused: Not on file    Forced sexual activity: Not on file  Other Topics Concern  . Not on file  Social History Narrative  . Not on file    FAMILY HISTORY: Family History  Problem Relation Age of Onset  . Heart attack Father 36    ALLERGIES:  is allergic to augmentin [amoxicillin-pot clavulanate]; levofloxacin; and prednisone.  MEDICATIONS:  Current Outpatient Medications  Medication Sig Dispense Refill  . bisacodyl (DULCOLAX) 10 MG suppository Place rectally.    Marland Kitchen loperamide (IMODIUM) 2 MG capsule Take 1 capsule (2 mg  total) by mouth as needed for diarrhea or loose stools. (Patient not taking: Reported on 01/03/2019) 30 capsule 0  . omeprazole (PRILOSEC) 40 MG capsule Take 1 capsule (40 mg total) by mouth 2 (two) times daily for 14 days. 28 capsule 0  . ondansetron (ZOFRAN) 4 MG tablet Take 1 tablet (4 mg total) by mouth every 6 (six) hours as needed for nausea. (Patient not taking: Reported on 01/03/2019) 20 tablet 0  . senna-docusate (SENOKOT-S) 8.6-50 MG tablet Take by mouth.     No current facility-administered medications for this visit.      PHYSICAL EXAMINATION: ECOG PERFORMANCE STATUS: 1 - Symptomatic but completely ambulatory Vitals:   01/04/19 1023  BP: (!) 172/99  Resp: 16  Temp: (!) 96.5 F (35.8 C)   Filed Weights   01/04/19 1023  Weight: 103 lb (46.7 kg)  Physical Exam Constitutional:      General: She is not in acute distress.    Comments: Thin, walk in   HENT:     Head: Normocephalic and atraumatic.  Eyes:     General: No scleral icterus.    Pupils: Pupils are equal, round, and reactive to light.  Neck:     Musculoskeletal: Normal range of motion and neck supple.  Cardiovascular:     Rate and Rhythm: Normal rate and regular rhythm.     Heart sounds: Normal heart sounds.  Pulmonary:     Effort: Pulmonary effort is normal. No respiratory distress.     Breath sounds: No wheezing.  Abdominal:     General: Bowel sounds are normal. There is no distension.     Palpations: Abdomen is soft. There is no mass.     Tenderness: There is no abdominal tenderness.  Musculoskeletal: Normal range of motion.        General: No deformity.  Skin:    General: Skin is warm and dry.     Findings: No erythema or rash.  Neurological:     Mental Status: She is alert and oriented to person, place, and time.     Cranial Nerves: No cranial nerve deficit.     Coordination: Coordination normal.  Psychiatric:        Behavior: Behavior normal.        Thought Content: Thought content normal.       LABORATORY DATA:  I have reviewed the data as listed Lab Results  Component Value Date   WBC 5.2 01/02/2019   HGB 10.3 (L) 01/02/2019   HCT 32.1 (L) 01/02/2019   MCV 87.2 01/02/2019   PLT 107 (L) 01/02/2019   Recent Labs    09/20/18 1315  09/23/18 0720 09/24/18 0600 01/02/19 1319  NA 134*   < > 133* 131* 139  K 3.1*   < > 4.3 4.1 4.9  CL 97*   < > 103 100 104  CO2 25   < > '23 24 27  ' GLUCOSE 120*   < > 102* 98 93  BUN 34*   < > 18 16 34*  CREATININE 1.79*   < > 1.42* 1.39* 1.66*  CALCIUM 8.3*   < > 8.3* 8.5* 8.9  GFRNONAA 30*   < > 39* 40* 32*  GFRAA 34*   < > 45* 47* 38*  PROT 7.4  --   --  6.3* 7.0  ALBUMIN 3.5  --   --  2.8* 3.9  AST 17  --   --  43* 17  ALT 17  --   --  49* 13  ALKPHOS 57  --   --  74 44  BILITOT 1.7*  --   --  0.8 0.6   < > = values in this interval not displayed.   Iron/TIBC/Ferritin/ %Sat    Component Value Date/Time   IRON 57 11/07/2018 1130   TIBC 288 11/07/2018 1130   FERRITIN 78 11/07/2018 1130   IRONPCTSAT 20 11/07/2018 1130    09/22/2018 folate 46, vitamin B12 305 09/21/2018, HIV negative.  RADIOGRAPHIC STUDIES: I have personally reviewed the radiological images as listed and agreed with the findings in the report. No results found. ASSESSMENT & PLAN:  1. Anemia due to stage 3b chronic kidney disease   2. Thrombocytopenia ([place])   3. B12 deficiency    # Anemia due to CKD, hemoglobin is slightly worse.  # Thrombocytopenia, plt trending down.  Smear showed tear drops cells.  Discussed again about bone marrow biopsy. She declined.  Repeat iron testing at next visit.   Vitamin B12 deficiency, recommend patient to continue vitamin B12 injections.  Primary care physician.  B12 is at low normal end.  Recommend pt to also start oral vitamin b12 1072mg daily. She prefers purchase OTC B12 tablets.   Orders Placed This Encounter  Procedures  . CBC with Differential/Platelet    Standing Status:   Future    Standing Expiration  Date:   01/04/2020  . Comprehensive metabolic panel    Standing Status:   Future    Standing Expiration Date:   01/04/2020    All questions were answered. The patient knows to call the clinic with any problems questions or concerns. CLeron Croak MD  Return of visit: 3 months.     ZEarlie Server MD, PhD 01/05/2019

## 2019-04-09 ENCOUNTER — Other Ambulatory Visit: Payer: Self-pay

## 2019-04-09 NOTE — Progress Notes (Signed)
Patient pre screened for office appointment, no questions or concerns today. Patient reminded of upcoming appointment time and date. 

## 2019-04-10 ENCOUNTER — Encounter: Payer: Self-pay | Admitting: Oncology

## 2019-04-10 ENCOUNTER — Inpatient Hospital Stay (HOSPITAL_BASED_OUTPATIENT_CLINIC_OR_DEPARTMENT_OTHER): Payer: BC Managed Care – PPO | Admitting: Oncology

## 2019-04-10 ENCOUNTER — Inpatient Hospital Stay: Payer: BC Managed Care – PPO | Attending: Oncology

## 2019-04-10 ENCOUNTER — Other Ambulatory Visit: Payer: Self-pay

## 2019-04-10 VITALS — BP 158/76 | HR 67 | Temp 96.5°F | Wt 114.6 lb

## 2019-04-10 DIAGNOSIS — Z79899 Other long term (current) drug therapy: Secondary | ICD-10-CM | POA: Diagnosis not present

## 2019-04-10 DIAGNOSIS — N1832 Chronic kidney disease, stage 3b: Secondary | ICD-10-CM

## 2019-04-10 DIAGNOSIS — D631 Anemia in chronic kidney disease: Secondary | ICD-10-CM | POA: Diagnosis present

## 2019-04-10 DIAGNOSIS — E538 Deficiency of other specified B group vitamins: Secondary | ICD-10-CM

## 2019-04-10 DIAGNOSIS — D696 Thrombocytopenia, unspecified: Secondary | ICD-10-CM

## 2019-04-10 DIAGNOSIS — Z8249 Family history of ischemic heart disease and other diseases of the circulatory system: Secondary | ICD-10-CM | POA: Insufficient documentation

## 2019-04-10 LAB — COMPREHENSIVE METABOLIC PANEL
ALT: 14 U/L (ref 0–44)
AST: 16 U/L (ref 15–41)
Albumin: 4.2 g/dL (ref 3.5–5.0)
Alkaline Phosphatase: 49 U/L (ref 38–126)
Anion gap: 7 (ref 5–15)
BUN: 34 mg/dL — ABNORMAL HIGH (ref 8–23)
CO2: 27 mmol/L (ref 22–32)
Calcium: 9.4 mg/dL (ref 8.9–10.3)
Chloride: 105 mmol/L (ref 98–111)
Creatinine, Ser: 1.79 mg/dL — ABNORMAL HIGH (ref 0.44–1.00)
GFR calc Af Amer: 34 mL/min — ABNORMAL LOW (ref >60)
GFR calc non Af Amer: 30 mL/min — ABNORMAL LOW (ref >60)
Glucose, Bld: 90 mg/dL (ref 70–99)
Potassium: 4.2 mmol/L (ref 3.5–5.1)
Sodium: 139 mmol/L (ref 135–145)
Total Bilirubin: 0.8 mg/dL (ref 0.3–1.2)
Total Protein: 7.3 g/dL (ref 6.5–8.1)

## 2019-04-10 LAB — CBC WITH DIFFERENTIAL/PLATELET
Abs Immature Granulocytes: 0.01 10*3/uL (ref 0.00–0.07)
Basophils Absolute: 0.1 10*3/uL (ref 0.0–0.1)
Basophils Relative: 1 %
Eosinophils Absolute: 0.3 10*3/uL (ref 0.0–0.5)
Eosinophils Relative: 7 %
HCT: 34.9 % — ABNORMAL LOW (ref 36.0–46.0)
Hemoglobin: 11 g/dL — ABNORMAL LOW (ref 12.0–15.0)
Immature Granulocytes: 0 %
Lymphocytes Relative: 19 %
Lymphs Abs: 1 10*3/uL (ref 0.7–4.0)
MCH: 28.3 pg (ref 26.0–34.0)
MCHC: 31.5 g/dL (ref 30.0–36.0)
MCV: 89.7 fL (ref 80.0–100.0)
Monocytes Absolute: 0.4 10*3/uL (ref 0.1–1.0)
Monocytes Relative: 8 %
Neutro Abs: 3.2 10*3/uL (ref 1.7–7.7)
Neutrophils Relative %: 65 %
Platelets: 135 10*3/uL — ABNORMAL LOW (ref 150–400)
RBC: 3.89 MIL/uL (ref 3.87–5.11)
RDW: 13 % (ref 11.5–15.5)
WBC: 5 10*3/uL (ref 4.0–10.5)
nRBC: 0 % (ref 0.0–0.2)

## 2019-04-10 LAB — IRON AND TIBC
Iron: 60 ug/dL (ref 28–170)
Saturation Ratios: 19 % (ref 10.4–31.8)
TIBC: 321 ug/dL (ref 250–450)
UIBC: 261 ug/dL

## 2019-04-10 LAB — RETIC PANEL
Immature Retic Fract: 2.7 % (ref 2.3–15.9)
RBC.: 3.92 MIL/uL (ref 3.87–5.11)
Retic Count, Absolute: 50.6 10*3/uL (ref 19.0–186.0)
Retic Ct Pct: 1.3 % (ref 0.4–3.1)
Reticulocyte Hemoglobin: 32.2 pg (ref >27.9)

## 2019-04-10 LAB — VITAMIN B12: Vitamin B-12: 1845 pg/mL — ABNORMAL HIGH (ref 180–914)

## 2019-04-10 LAB — FERRITIN: Ferritin: 48 ng/mL (ref 11–307)

## 2019-04-10 NOTE — Progress Notes (Signed)
Hematology/Oncology follow up note [facility] [phone] Fax:(336) [phone]   Patient Care Team: Maryland Pink, MD as PCP - General (Family Medicine) [place] Pink, MD as Consulting Physician (Family Medicine) Christene Lye, MD (General Surgery)  REFERRING PROVIDER: Maryland Pink, MD  REASON FOR VISIT:  Follow-up for anemia  HISTORY OF PRESENTING ILLNESS:  Jamie Foley is a  64 y.o.  female with PMH listed below who was referred to me for evaluation of anemia Reviewed patient's recent labs that was done.  09/24/2018 Labs revealed anemia with hemoglobin of 9.2, platelet 106,000, Patient was recently admitted from 09/19/2020 09/24/2018 due to nausea, vomiting, diarrhea, secondary to gastroenteritis. CT abdomen pelvis showed no acute abnormalities.  C. difficile negative.  GI pathogens panel negative.  Was seen and evaluated by gastroenterology, felt patient had food poisoning. She also had AKI due to dehydration.  Improved after IV fluids. She had a history of autosomal dominant polycystic kidney disease, she has been following up with nephrologist at [facility].  She had a follow-up appointment with Dr. Lin Landsman on 11/02/2019 patient was referred to heme-onc to Establish care for evaluation of anemia.  Associated signs and symptoms: Patient reports fatigue.  SOB with exertion.  Denies weight loss, easy bruising, hematochezia, hemoptysis, hematuria. Context: History of GI bleeding: Denies               History of Chronic kidney disease; history of polycystic kidney disease.                               Last colonoscopy: Reports that she had colonoscopy about 1 to 2 years ago at outside facility.  INTERVAL HISTORY Jamie Foley is a 64 y.o. female who has above history reviewed by me today presents for follow up visit for management of anemia. Patient reports feeling well today.  She has no new complaints. Chronic fatigue at  baseline.   Review of Systems  Constitutional: Positive for fatigue. Negative for appetite change, chills and fever.  HENT:   Negative for hearing loss and voice change.   Eyes: Negative for eye problems.  Respiratory: Negative for chest tightness and cough.   Cardiovascular: Negative for chest pain.  Gastrointestinal: Negative for abdominal distention, abdominal pain and blood in stool.  Endocrine: Negative for hot flashes.  Genitourinary: Negative for difficulty urinating and frequency.   Musculoskeletal: Negative for arthralgias.  Skin: Negative for itching and rash.  Neurological: Negative for extremity weakness.  Hematological: Negative for adenopathy.  Psychiatric/Behavioral: Negative for confusion.     MEDICAL HISTORY:  Past Medical History:  Diagnosis Date  . Anemia   . Arthritis   . Complication of anesthesia    HARD TO WAKE UP AFTER ANKLE SURGERY-FELT LIKE SHE RECEIVED TO MUCH ANESTHESIA  . Osteoporosis   . Polycystic kidney disease   . Renal insufficiency     SURGICAL HISTORY: Past Surgical History:  Procedure Laterality Date  . ABDOMINAL HYSTERECTOMY    . CATARACT EXTRACTION W/PHACO Left 10/14/2014   Procedure: CATARACT EXTRACTION PHACO AND INTRAOCULAR LENS PLACEMENT (IOC);  Surgeon: Estill Cotta, MD;  Location: [facility];  Service: Ophthalmology;  Laterality: Left;  [place] 01:09 AP% 60.0 CDE 37.21 fluid pack lot #[id number]  . CHOLECYSTECTOMY    . ESOPHAGOGASTRODUODENOSCOPY (EGD) WITH PROPOFOL N/A 12/01/2018   Procedure: ESOPHAGOGASTRODUODENOSCOPY (EGD) WITH PROPOFOL;  Surgeon: Lin Landsman, MD;  Location: [place];  Service: Gastroenterology;  Laterality:  N/A;  . EYE SURGERY Left    DETACHED RETINA   . HERNIA REPAIR    . INGUINAL HERNIA REPAIR Left 08/03/2016   Procedure: HERNIA REPAIR INGUINAL ADULT;  Surgeon: Leonie Green, MD;  Location: [facility];  Service: General;  Laterality: Left;  . right ankle fracture Right   . VENTRAL HERNIA  REPAIR N/A 08/03/2016   Procedure: HERNIA REPAIR VENTRAL ADULT;  Surgeon: Leonie Green, MD;  Location: [facility];  Service: General;  Laterality: N/A;    SOCIAL HISTORY: Social History   Socioeconomic History  . Marital status: Legally Separated    Spouse name: Not on file  . Number of children: Not on file  . Years of education: Not on file  . Highest education level: Not on file  Occupational History  . Not on file  Tobacco Use  . Smoking status: Never Smoker  . Smokeless tobacco: Never Used  Substance and Sexual Activity  . Alcohol use: No  . Drug use: No  . Sexual activity: Not on file  Other Topics Concern  . Not on file  Social History Narrative  . Not on file   Social Determinants of Health   Financial Resource Strain:   . Difficulty of Paying Living Expenses: Not on file  Food Insecurity:   . Worried About Charity fundraiser in the Last Year: Not on file  . Ran Out of Food in the Last Year: Not on file  Transportation Needs:   . Lack of Transportation (Medical): Not on file  . Lack of Transportation (Non-Medical): Not on file  Physical Activity:   . Days of Exercise per Week: Not on file  . Minutes of Exercise per Session: Not on file  Stress:   . Feeling of Stress : Not on file  Social Connections:   . Frequency of Communication with Friends and Family: Not on file  . Frequency of Social Gatherings with Friends and Family: Not on file  . Attends Religious Services: Not on file  . Active Member of Clubs or Organizations: Not on file  . Attends Archivist Meetings: Not on file  . Marital Status: Not on file  Intimate Partner Violence:   . Fear of Current or Ex-Partner: Not on file  . Emotionally Abused: Not on file  . Physically Abused: Not on file  . Sexually Abused: Not on file    FAMILY HISTORY: Family History  Problem Relation Age of Onset  . Heart attack Father 38    ALLERGIES:  is allergic to augmentin [amoxicillin-pot  clavulanate]; levofloxacin; and prednisone.  MEDICATIONS:  Current Outpatient Medications  Medication Sig Dispense Refill  . bisacodyl (DULCOLAX) 10 MG suppository Place rectally.    Marland Kitchen loperamide (IMODIUM) 2 MG capsule Take 1 capsule (2 mg total) by mouth as needed for diarrhea or loose stools. 30 capsule 0  . ondansetron (ZOFRAN) 4 MG tablet Take 1 tablet (4 mg total) by mouth every 6 (six) hours as needed for nausea. 20 tablet 0  . senna-docusate (SENOKOT-S) 8.6-50 MG tablet Take by mouth.     No current facility-administered medications for this visit.     PHYSICAL EXAMINATION: ECOG PERFORMANCE STATUS: 1 - Symptomatic but completely ambulatory Vitals:   04/10/19 0950  BP: (!) 158/76  Pulse: 67  Temp: (!) 96.5 F (35.8 C)  SpO2: 100%   Filed Weights   04/10/19 0950  Weight: 114 lb 9.6 oz (52 kg)    Physical Exam Constitutional:  General: She is not in acute distress.    Comments: Thin, walk in   HENT:     Head: Normocephalic and atraumatic.  Eyes:     General: No scleral icterus.    Pupils: Pupils are equal, round, and reactive to light.  Cardiovascular:     Rate and Rhythm: Normal rate and regular rhythm.     Heart sounds: Normal heart sounds.  Pulmonary:     Effort: Pulmonary effort is normal. No respiratory distress.     Breath sounds: No wheezing.  Abdominal:     General: Bowel sounds are normal. There is no distension.     Palpations: Abdomen is soft. There is no mass.     Tenderness: There is no abdominal tenderness.  Musculoskeletal:        General: No deformity. Normal range of motion.     Cervical back: Normal range of motion and neck supple.  Skin:    General: Skin is warm and dry.     Findings: No erythema or rash.  Neurological:     Mental Status: She is alert and oriented to person, place, and time.     Cranial Nerves: No cranial nerve deficit.     Coordination: Coordination normal.  Psychiatric:        Behavior: Behavior normal.         Thought Content: Thought content normal.      LABORATORY DATA:  I have reviewed the data as listed Lab Results  Component Value Date   WBC 5.0 04/10/2019   HGB 11.0 (L) 04/10/2019   HCT 34.9 (L) 04/10/2019   MCV 89.7 04/10/2019   PLT 135 (L) 04/10/2019   Recent Labs    09/24/18 0600 01/02/19 1319 04/10/19 0931  NA 131* 139 139  K 4.1 4.9 4.2  CL 100 104 105  CO2 _0 GLUCOSE 98 93 90  BUN 16 34* 34*  CREATININE 1.39* 1.66* 1.79*  CALCIUM 8.5* 8.9 9.4  GFRNONAA 40* 32* 30*  GFRAA 47* 38* 34*  PROT 6.3* 7.0 7.3  ALBUMIN 2.8* 3.9 4.2  AST 43* 17 16  ALT 49* 13 14  ALKPHOS 74 44 49  BILITOT 0.8 0.6 0.8   Iron/TIBC/Ferritin/ %Sat    Component Value Date/Time   IRON 60 04/10/2019 0931   TIBC 321 04/10/2019 0931   FERRITIN 48 04/10/2019 0931   IRONPCTSAT 19 04/10/2019 0931    09/22/2018 folate 46, vitamin B12 305 09/21/2018, HIV negative.  RADIOGRAPHIC STUDIES: I have personally reviewed the radiological images as listed and agreed with the findings in the report. No results found. ASSESSMENT & PLAN:  1. B12 deficiency   2. Anemia due to stage 3b chronic kidney disease   Labs are reviewed and discussed with patient. # Anemia due to CKD, hemoglobin has improved to 11..  # Thrombocytopenia, plt has improved. Previous smear showed teardrop cells and patient declined bone marrow biopsy. Discussed with patient that today's blood work shows slight improvement. Recommend continue observation .Marland Kitchen   Orders Placed This Encounter  Procedures  . CBC with Differential    Standing Status:   Future    Standing Expiration Date:   04/09/2020  . Comprehensive metabolic panel    Standing Status:   Future    Standing Expiration Date:   04/09/2020  . Vitamin B12    Standing Status:   Future    Standing Expiration Date:   04/09/2020    All questions were answered. The patient knows  to call the clinic with any problems questions or concerns.  Return of visit: 6 months.      Earlie Server, MD, PhD 04/10/2019

## 2019-04-13 LAB — METHYLMALONIC ACID, SERUM: Methylmalonic Acid, Quantitative: 235 nmol/L (ref 0–378)

## 2019-10-09 ENCOUNTER — Other Ambulatory Visit: Payer: BC Managed Care – PPO

## 2019-10-09 ENCOUNTER — Ambulatory Visit: Payer: BC Managed Care – PPO | Admitting: Oncology

## 2019-10-23 ENCOUNTER — Inpatient Hospital Stay: Payer: BC Managed Care – PPO | Attending: Oncology

## 2019-10-23 ENCOUNTER — Encounter: Payer: Self-pay | Admitting: Oncology

## 2019-10-23 ENCOUNTER — Other Ambulatory Visit: Payer: Self-pay

## 2019-10-23 ENCOUNTER — Inpatient Hospital Stay (HOSPITAL_BASED_OUTPATIENT_CLINIC_OR_DEPARTMENT_OTHER): Payer: BC Managed Care – PPO | Admitting: Oncology

## 2019-10-23 VITALS — BP 143/87 | HR 69 | Temp 96.9°F | Resp 18 | Wt 120.8 lb

## 2019-10-23 DIAGNOSIS — E538 Deficiency of other specified B group vitamins: Secondary | ICD-10-CM | POA: Diagnosis not present

## 2019-10-23 DIAGNOSIS — D631 Anemia in chronic kidney disease: Secondary | ICD-10-CM

## 2019-10-23 DIAGNOSIS — D696 Thrombocytopenia, unspecified: Secondary | ICD-10-CM | POA: Insufficient documentation

## 2019-10-23 DIAGNOSIS — N1832 Chronic kidney disease, stage 3b: Secondary | ICD-10-CM

## 2019-10-23 LAB — COMPREHENSIVE METABOLIC PANEL
ALT: 11 U/L (ref 0–44)
AST: 14 U/L — ABNORMAL LOW (ref 15–41)
Albumin: 4.2 g/dL (ref 3.5–5.0)
Alkaline Phosphatase: 46 U/L (ref 38–126)
Anion gap: 7 (ref 5–15)
BUN: 32 mg/dL — ABNORMAL HIGH (ref 8–23)
CO2: 27 mmol/L (ref 22–32)
Calcium: 8.7 mg/dL — ABNORMAL LOW (ref 8.9–10.3)
Chloride: 106 mmol/L (ref 98–111)
Creatinine, Ser: 1.81 mg/dL — ABNORMAL HIGH (ref 0.44–1.00)
GFR calc Af Amer: 34 mL/min — ABNORMAL LOW (ref >60)
GFR calc non Af Amer: 29 mL/min — ABNORMAL LOW (ref >60)
Glucose, Bld: 94 mg/dL (ref 70–99)
Potassium: 4.1 mmol/L (ref 3.5–5.1)
Sodium: 140 mmol/L (ref 135–145)
Total Bilirubin: 1 mg/dL (ref 0.3–1.2)
Total Protein: 7.2 g/dL (ref 6.5–8.1)

## 2019-10-23 LAB — CBC WITH DIFFERENTIAL/PLATELET
Abs Immature Granulocytes: 0.02 10*3/uL (ref 0.00–0.07)
Basophils Absolute: 0.1 10*3/uL (ref 0.0–0.1)
Basophils Relative: 1 %
Eosinophils Absolute: 0.3 10*3/uL (ref 0.0–0.5)
Eosinophils Relative: 4 %
HCT: 34 % — ABNORMAL LOW (ref 36.0–46.0)
Hemoglobin: 11.4 g/dL — ABNORMAL LOW (ref 12.0–15.0)
Immature Granulocytes: 0 %
Lymphocytes Relative: 14 %
Lymphs Abs: 0.9 10*3/uL (ref 0.7–4.0)
MCH: 28.8 pg (ref 26.0–34.0)
MCHC: 33.5 g/dL (ref 30.0–36.0)
MCV: 85.9 fL (ref 80.0–100.0)
Monocytes Absolute: 0.4 10*3/uL (ref 0.1–1.0)
Monocytes Relative: 6 %
Neutro Abs: 4.6 10*3/uL (ref 1.7–7.7)
Neutrophils Relative %: 75 %
Platelets: 139 10*3/uL — ABNORMAL LOW (ref 150–400)
RBC: 3.96 MIL/uL (ref 3.87–5.11)
RDW: 12.1 % (ref 11.5–15.5)
WBC: 6.2 10*3/uL (ref 4.0–10.5)
nRBC: 0 % (ref 0.0–0.2)

## 2019-10-23 LAB — FERRITIN: Ferritin: 52 ng/mL (ref 11–307)

## 2019-10-23 LAB — IRON AND TIBC
Iron: 75 ug/dL (ref 28–170)
Saturation Ratios: 26 % (ref 10.4–31.8)
TIBC: 284 ug/dL (ref 250–450)
UIBC: 209 ug/dL

## 2019-10-23 LAB — VITAMIN B12: Vitamin B-12: 681 pg/mL (ref 180–914)

## 2019-10-23 NOTE — Progress Notes (Signed)
Pt here for follow up. No new concerns voiced.   

## 2019-10-23 NOTE — Progress Notes (Signed)
Hematology/Oncology follow up note [facility] Telephone:(336) [phone] Fax:(336) [phone]   Patient Care Team: Maryland Pink, MD as PCP - General (Family Medicine) Maryland Pink, MD as Consulting Physician (Family Medicine) Christene Lye, MD (General Surgery)  REFERRING PROVIDER: Maryland Pink, MD  REASON FOR VISIT:  Follow-up for anemia  HISTORY OF PRESENTING ILLNESS:  Jamie Foley is a  64 y.o.  female with PMH listed below who was referred to me for evaluation of anemia Reviewed patient's recent labs that was done.  09/24/2018 Labs revealed anemia with hemoglobin of 9.2, platelet 106,000, Patient was recently admitted from 09/19/2020 09/24/2018 due to nausea, vomiting, diarrhea, secondary to gastroenteritis. CT abdomen pelvis showed no acute abnormalities.  C. difficile negative.  GI pathogens panel negative.  Was seen and evaluated by gastroenterology, felt patient had food poisoning. She also had AKI due to dehydration.  Improved after IV fluids. She had a history of autosomal dominant polycystic kidney disease, she has been following up with nephrologist at [facility].  She had a follow-up appointment with Dr. Lin Landsman on 11/02/2019 patient was referred to heme-onc to Establish care for evaluation of anemia.  Associated signs and symptoms: Patient reports fatigue.  SOB with exertion.  Denies weight loss, easy bruising, hematochezia, hemoptysis, hematuria. Context: History of GI bleeding: Denies               History of Chronic kidney disease; history of polycystic kidney disease.                               Last colonoscopy: Reports that she had colonoscopy about 1 to 2 years ago at outside facility.  INTERVAL HISTORY Jamie Foley is a 64 y.o. female who has above history reviewed by me today presents for follow up visit for management of anemia. Patient reports feeling well today.  She was accompanied by her husband.  No new  complaints.  Fatigue has improved.  Review of Systems  Constitutional: Negative for appetite change, chills, fatigue and fever.  HENT:   Negative for hearing loss and voice change.   Eyes: Negative for eye problems.  Respiratory: Negative for chest tightness and cough.   Cardiovascular: Negative for chest pain.  Gastrointestinal: Negative for abdominal distention, abdominal pain and blood in stool.  Endocrine: Negative for hot flashes.  Genitourinary: Negative for difficulty urinating and frequency.   Musculoskeletal: Negative for arthralgias.  Skin: Negative for itching and rash.  Neurological: Negative for extremity weakness.  Hematological: Negative for adenopathy.  Psychiatric/Behavioral: Negative for confusion.     MEDICAL HISTORY:  Past Medical History:  Diagnosis Date  . Anemia   . Arthritis   . Complication of anesthesia    HARD TO WAKE UP AFTER ANKLE SURGERY-FELT LIKE SHE RECEIVED TO MUCH ANESTHESIA  . Osteoporosis   . Polycystic kidney disease   . Renal insufficiency     SURGICAL HISTORY: Past Surgical History:  Procedure Laterality Date  . ABDOMINAL HYSTERECTOMY    . CATARACT EXTRACTION W/PHACO Left 10/14/2014   Procedure: CATARACT EXTRACTION PHACO AND INTRAOCULAR LENS PLACEMENT (IOC);  Surgeon: Estill Cotta, MD;  Location: [facility];  Service: Ophthalmology;  Laterality: Left;  [place] 01:09 AP% 60.0 CDE 37.21 fluid pack lot #[id number]  . CHOLECYSTECTOMY    . ESOPHAGOGASTRODUODENOSCOPY (EGD) WITH PROPOFOL N/A 12/01/2018   Procedure: ESOPHAGOGASTRODUODENOSCOPY (EGD) WITH PROPOFOL;  Surgeon: Lin Landsman, MD;  Location: [place];  Service: Gastroenterology;  Laterality: N/A;  . EYE SURGERY Left    DETACHED RETINA   . HERNIA REPAIR    . INGUINAL HERNIA REPAIR Left 08/03/2016   Procedure: HERNIA REPAIR INGUINAL ADULT;  Surgeon: Leonie Green, MD;  Location: [facility];  Service: General;  Laterality: Left;  . right ankle fracture Right   . VENTRAL  HERNIA REPAIR N/A 08/03/2016   Procedure: HERNIA REPAIR VENTRAL ADULT;  Surgeon: Leonie Green, MD;  Location: [facility];  Service: General;  Laterality: N/A;    SOCIAL HISTORY: Social History   Socioeconomic History  . Marital status: Legally Separated    Spouse name: Not on file  . Number of children: Not on file  . Years of education: Not on file  . Highest education level: Not on file  Occupational History  . Not on file  Tobacco Use  . Smoking status: Never Smoker  . Smokeless tobacco: Never Used  Substance and Sexual Activity  . Alcohol use: No  . Drug use: No  . Sexual activity: Not on file  Other Topics Concern  . Not on file  Social History Narrative  . Not on file   Social Determinants of Health   Financial Resource Strain:   . Difficulty of Paying Living Expenses:   Food Insecurity:   . Worried About Charity fundraiser in the Last Year:   . Arboriculturist in the Last Year:   Transportation Needs:   . Film/video editor (Medical):   Marland Kitchen Lack of Transportation (Non-Medical):   Physical Activity:   . Days of Exercise per Week:   . Minutes of Exercise per Session:   Stress:   . Feeling of Stress :   Social Connections:   . Frequency of Communication with Friends and Family:   . Frequency of Social Gatherings with Friends and Family:   . Attends Religious Services:   . Active Member of Clubs or Organizations:   . Attends Archivist Meetings:   Marland Kitchen Marital Status:   Intimate Partner Violence:   . Fear of Current or Ex-Partner:   . Emotionally Abused:   Marland Kitchen Physically Abused:   . Sexually Abused:     FAMILY HISTORY: Family History  Problem Relation Age of Onset  . Heart attack Father 76    ALLERGIES:  is allergic to augmentin [amoxicillin-pot clavulanate], levofloxacin, and prednisone.  MEDICATIONS:  Current Outpatient Medications  Medication Sig Dispense Refill  . cyanocobalamin 1000 MCG tablet Take by mouth.    . loperamide  (IMODIUM) 2 MG capsule Take 1 capsule (2 mg total) by mouth as needed for diarrhea or loose stools. (Patient not taking: Reported on 10/23/2019) 30 capsule 0  . ondansetron (ZOFRAN) 4 MG tablet Take 1 tablet (4 mg total) by mouth every 6 (six) hours as needed for nausea. (Patient not taking: Reported on 10/23/2019) 20 tablet 0  . senna-docusate (SENOKOT-S) 8.6-50 MG tablet Take by mouth. (Patient not taking: Reported on 10/23/2019)     No current facility-administered medications for this visit.     PHYSICAL EXAMINATION: ECOG PERFORMANCE STATUS: 1 - Symptomatic but completely ambulatory Vitals:   10/23/19 1321  BP: (!) 143/87  Pulse: 69  Resp: 18  Temp: (!) 96.9 F (36.1 C)   Filed Weights   10/23/19 1321  Weight: 120 lb 12.8 oz (54.8 kg)    Physical Exam Constitutional:      General: She is not in acute distress.    Comments: Thin, walk in  HENT:     Head: Normocephalic and atraumatic.  Eyes:     General: No scleral icterus.    Pupils: Pupils are equal, round, and reactive to light.  Cardiovascular:     Rate and Rhythm: Normal rate and regular rhythm.     Heart sounds: Normal heart sounds.  Pulmonary:     Effort: Pulmonary effort is normal. No respiratory distress.     Breath sounds: No wheezing.  Abdominal:     General: Bowel sounds are normal. There is no distension.     Palpations: Abdomen is soft. There is no mass.     Tenderness: There is no abdominal tenderness.  Musculoskeletal:        General: No deformity. Normal range of motion.     Cervical back: Normal range of motion and neck supple.  Skin:    General: Skin is warm and dry.     Findings: No erythema or rash.  Neurological:     Mental Status: She is alert and oriented to person, place, and time.     Cranial Nerves: No cranial nerve deficit.     Coordination: Coordination normal.  Psychiatric:        Behavior: Behavior normal.        Thought Content: Thought content normal.      LABORATORY DATA:    I have reviewed the data as listed Lab Results  Component Value Date   WBC 6.2 10/23/2019   HGB 11.4 (L) 10/23/2019   HCT 34.0 (L) 10/23/2019   MCV 85.9 10/23/2019   PLT 139 (L) 10/23/2019   Recent Labs    01/02/19 1319 04/10/19 0931 10/23/19 1251  NA 139 139 140  K 4.9 4.2 4.1  CL 104 105 106  CO2 _0 GLUCOSE 93 90 94  BUN 34* 34* 32*  CREATININE 1.66* 1.79* 1.81*  CALCIUM 8.9 9.4 8.7*  GFRNONAA 32* 30* 29*  GFRAA 38* 34* 34*  PROT 7.0 7.3 7.2  ALBUMIN 3.9 4.2 4.2  AST 17 16 14*  ALT _1 ALKPHOS 44 49 46  BILITOT 0.6 0.8 1.0   Iron/TIBC/Ferritin/ %Sat    Component Value Date/Time   IRON 75 10/23/2019 1251   TIBC 284 10/23/2019 1251   FERRITIN 52 10/23/2019 1251   IRONPCTSAT 26 10/23/2019 1251    09/22/2018 folate 46, vitamin B12 305 09/21/2018, HIV negative.  RADIOGRAPHIC STUDIES: I have personally reviewed the radiological images as listed and agreed with the findings in the report. No results found. ASSESSMENT & PLAN:  1. Anemia due to stage 3b chronic kidney disease   2. B12 deficiency   3. Thrombocytopenia ([place])   Labs are reviewed and discussed with patient. # Anemia due to CKD,  Hemoglobin has improved to 11.4, iron panel shows adequate iron. No need for intervention at this point.  Continue monitor.  . # Thrombocytopenia, plt has improved. Previous smear showed teardrop cells and patient declined bone marrow biopsy. Platelet counts remain stable at 1 39,000.  Continue to monitor.  #History of vitamin B12 deficiency, vitamin B12 level is at 681.  Continue vitamin B12 supplementation.  She may decrease to vitamin B12 500 MCG daily. ..   Orders Placed This Encounter  Procedures  . Comprehensive metabolic panel    Standing Status:   Future    Standing Expiration Date:   10/22/2020  . CBC with Differential/Platelet    Standing Status:   Future    Standing Expiration Date:  10/22/2020  . Vitamin B12    Standing Status:   Future     Standing Expiration Date:   10/22/2020  . Ferritin    Standing Status:   Future    Standing Expiration Date:   04/24/2020  . Iron and TIBC    Standing Status:   Future    Standing Expiration Date:   10/22/2020    All questions were answered. The patient knows to call the clinic with any problems questions or concerns.  Return of visit: 12 months.     Earlie Server, MD, PhD 10/23/2019

## 2020-01-06 ENCOUNTER — Other Ambulatory Visit: Payer: Self-pay

## 2020-01-06 ENCOUNTER — Emergency Department: Payer: BC Managed Care – PPO

## 2020-01-06 DIAGNOSIS — E785 Hyperlipidemia, unspecified: Secondary | ICD-10-CM | POA: Diagnosis present

## 2020-01-06 DIAGNOSIS — Z20822 Contact with and (suspected) exposure to covid-19: Secondary | ICD-10-CM | POA: Diagnosis present

## 2020-01-06 DIAGNOSIS — Z9109 Other allergy status, other than to drugs and biological substances: Secondary | ICD-10-CM

## 2020-01-06 DIAGNOSIS — J209 Acute bronchitis, unspecified: Secondary | ICD-10-CM | POA: Diagnosis present

## 2020-01-06 DIAGNOSIS — N179 Acute kidney failure, unspecified: Secondary | ICD-10-CM | POA: Diagnosis present

## 2020-01-06 DIAGNOSIS — D631 Anemia in chronic kidney disease: Secondary | ICD-10-CM | POA: Diagnosis present

## 2020-01-06 DIAGNOSIS — K661 Hemoperitoneum: Secondary | ICD-10-CM | POA: Diagnosis not present

## 2020-01-06 DIAGNOSIS — Z79899 Other long term (current) drug therapy: Secondary | ICD-10-CM

## 2020-01-06 DIAGNOSIS — W19XXXA Unspecified fall, initial encounter: Secondary | ICD-10-CM | POA: Diagnosis present

## 2020-01-06 DIAGNOSIS — Q613 Polycystic kidney, unspecified: Secondary | ICD-10-CM

## 2020-01-06 DIAGNOSIS — N1832 Chronic kidney disease, stage 3b: Secondary | ICD-10-CM | POA: Diagnosis present

## 2020-01-06 DIAGNOSIS — I700 Atherosclerosis of aorta: Secondary | ICD-10-CM | POA: Diagnosis present

## 2020-01-06 DIAGNOSIS — D509 Iron deficiency anemia, unspecified: Secondary | ICD-10-CM | POA: Diagnosis present

## 2020-01-06 DIAGNOSIS — K5900 Constipation, unspecified: Secondary | ICD-10-CM | POA: Diagnosis present

## 2020-01-06 DIAGNOSIS — Z881 Allergy status to other antibiotic agents status: Secondary | ICD-10-CM

## 2020-01-06 DIAGNOSIS — Z9049 Acquired absence of other specified parts of digestive tract: Secondary | ICD-10-CM

## 2020-01-06 DIAGNOSIS — Z90710 Acquired absence of both cervix and uterus: Secondary | ICD-10-CM

## 2020-01-06 DIAGNOSIS — M810 Age-related osteoporosis without current pathological fracture: Secondary | ICD-10-CM | POA: Diagnosis present

## 2020-01-06 DIAGNOSIS — E538 Deficiency of other specified B group vitamins: Secondary | ICD-10-CM | POA: Diagnosis present

## 2020-01-06 LAB — CBC
HCT: 31.2 % — ABNORMAL LOW (ref 36.0–46.0)
Hemoglobin: 10 g/dL — ABNORMAL LOW (ref 12.0–15.0)
MCH: 27.8 pg (ref 26.0–34.0)
MCHC: 32.1 g/dL (ref 30.0–36.0)
MCV: 86.7 fL (ref 80.0–100.0)
Platelets: 174 10*3/uL (ref 150–400)
RBC: 3.6 MIL/uL — ABNORMAL LOW (ref 3.87–5.11)
RDW: 12 % (ref 11.5–15.5)
WBC: 8.8 10*3/uL (ref 4.0–10.5)
nRBC: 0 % (ref 0.0–0.2)

## 2020-01-06 LAB — COMPREHENSIVE METABOLIC PANEL
ALT: 11 U/L (ref 0–44)
AST: 16 U/L (ref 15–41)
Albumin: 3.8 g/dL (ref 3.5–5.0)
Alkaline Phosphatase: 38 U/L (ref 38–126)
Anion gap: 12 (ref 5–15)
BUN: 30 mg/dL — ABNORMAL HIGH (ref 8–23)
CO2: 24 mmol/L (ref 22–32)
Calcium: 9.3 mg/dL (ref 8.9–10.3)
Chloride: 102 mmol/L (ref 98–111)
Creatinine, Ser: 1.86 mg/dL — ABNORMAL HIGH (ref 0.44–1.00)
GFR calc Af Amer: 33 mL/min — ABNORMAL LOW (ref >60)
GFR calc non Af Amer: 28 mL/min — ABNORMAL LOW (ref >60)
Glucose, Bld: 129 mg/dL — ABNORMAL HIGH (ref 70–99)
Potassium: 4.1 mmol/L (ref 3.5–5.1)
Sodium: 138 mmol/L (ref 135–145)
Total Bilirubin: 0.9 mg/dL (ref 0.3–1.2)
Total Protein: 6.9 g/dL (ref 6.5–8.1)

## 2020-01-06 LAB — URINALYSIS, COMPLETE (UACMP) WITH MICROSCOPIC
Bilirubin Urine: NEGATIVE
Glucose, UA: NEGATIVE mg/dL
Hgb urine dipstick: NEGATIVE
Ketones, ur: NEGATIVE mg/dL
Nitrite: NEGATIVE
Protein, ur: NEGATIVE mg/dL
Specific Gravity, Urine: 1.01 (ref 1.005–1.030)
Squamous Epithelial / HPF: NONE SEEN (ref 0–5)
pH: 5 (ref 5.0–8.0)

## 2020-01-06 LAB — LIPASE, BLOOD: Lipase: 31 U/L (ref 11–51)

## 2020-01-06 NOTE — ED Triage Notes (Signed)
Pt comes POV with abdominal pain after taking half an amoxicillin. Pt states that she had the amoxicillin from previous infection and she has bronchitis so she started taking them for that. Pt states medications are slightly out of date. Also reports sob from the bronchitis.

## 2020-01-06 NOTE — ED Notes (Signed)
Patient called for vital signs with no answer. 

## 2020-01-06 NOTE — ED Notes (Signed)
Patient called for vital sign with no answer

## 2020-01-07 ENCOUNTER — Inpatient Hospital Stay
Admission: EM | Admit: 2020-01-07 | Discharge: 2020-01-08 | DRG: 394 | Disposition: A | Discharge status: Home / Self Care | Payer: BC Managed Care – PPO | Attending: Internal Medicine | Admitting: Internal Medicine

## 2020-01-07 ENCOUNTER — Inpatient Hospital Stay: Payer: BC Managed Care – PPO

## 2020-01-07 ENCOUNTER — Emergency Department: Payer: BC Managed Care – PPO

## 2020-01-07 DIAGNOSIS — Z9109 Other allergy status, other than to drugs and biological substances: Secondary | ICD-10-CM | POA: Diagnosis not present

## 2020-01-07 DIAGNOSIS — D735 Infarction of spleen: Secondary | ICD-10-CM | POA: Diagnosis not present

## 2020-01-07 DIAGNOSIS — D649 Anemia, unspecified: Secondary | ICD-10-CM | POA: Diagnosis present

## 2020-01-07 DIAGNOSIS — R1084 Generalized abdominal pain: Secondary | ICD-10-CM | POA: Diagnosis not present

## 2020-01-07 DIAGNOSIS — Z9049 Acquired absence of other specified parts of digestive tract: Secondary | ICD-10-CM | POA: Diagnosis not present

## 2020-01-07 DIAGNOSIS — Q613 Polycystic kidney, unspecified: Secondary | ICD-10-CM

## 2020-01-07 DIAGNOSIS — Z79899 Other long term (current) drug therapy: Secondary | ICD-10-CM | POA: Diagnosis not present

## 2020-01-07 DIAGNOSIS — Z20822 Contact with and (suspected) exposure to covid-19: Secondary | ICD-10-CM | POA: Diagnosis present

## 2020-01-07 DIAGNOSIS — K661 Hemoperitoneum: Secondary | ICD-10-CM | POA: Diagnosis present

## 2020-01-07 DIAGNOSIS — N189 Chronic kidney disease, unspecified: Secondary | ICD-10-CM | POA: Diagnosis not present

## 2020-01-07 DIAGNOSIS — I700 Atherosclerosis of aorta: Secondary | ICD-10-CM | POA: Diagnosis present

## 2020-01-07 DIAGNOSIS — Z90710 Acquired absence of both cervix and uterus: Secondary | ICD-10-CM | POA: Diagnosis not present

## 2020-01-07 DIAGNOSIS — N1832 Chronic kidney disease, stage 3b: Secondary | ICD-10-CM | POA: Diagnosis present

## 2020-01-07 DIAGNOSIS — Z881 Allergy status to other antibiotic agents status: Secondary | ICD-10-CM | POA: Diagnosis not present

## 2020-01-07 DIAGNOSIS — E785 Hyperlipidemia, unspecified: Secondary | ICD-10-CM | POA: Diagnosis present

## 2020-01-07 DIAGNOSIS — J209 Acute bronchitis, unspecified: Secondary | ICD-10-CM | POA: Diagnosis present

## 2020-01-07 DIAGNOSIS — W19XXXA Unspecified fall, initial encounter: Secondary | ICD-10-CM | POA: Diagnosis present

## 2020-01-07 DIAGNOSIS — R109 Unspecified abdominal pain: Secondary | ICD-10-CM | POA: Diagnosis present

## 2020-01-07 DIAGNOSIS — K5900 Constipation, unspecified: Secondary | ICD-10-CM | POA: Diagnosis present

## 2020-01-07 DIAGNOSIS — D631 Anemia in chronic kidney disease: Secondary | ICD-10-CM | POA: Diagnosis present

## 2020-01-07 DIAGNOSIS — N179 Acute kidney failure, unspecified: Secondary | ICD-10-CM | POA: Diagnosis present

## 2020-01-07 DIAGNOSIS — M810 Age-related osteoporosis without current pathological fracture: Secondary | ICD-10-CM | POA: Diagnosis present

## 2020-01-07 DIAGNOSIS — E538 Deficiency of other specified B group vitamins: Secondary | ICD-10-CM | POA: Diagnosis present

## 2020-01-07 DIAGNOSIS — D509 Iron deficiency anemia, unspecified: Secondary | ICD-10-CM | POA: Diagnosis present

## 2020-01-07 LAB — IRON AND TIBC
Iron: 31 ug/dL (ref 28–170)
Saturation Ratios: 12 % (ref 10.4–31.8)
TIBC: 255 ug/dL (ref 250–450)
UIBC: 224 ug/dL

## 2020-01-07 LAB — PROTIME-INR
INR: 1.1 (ref 0.8–1.2)
Prothrombin Time: 13.6 seconds (ref 11.4–15.2)

## 2020-01-07 LAB — HEMOGLOBIN AND HEMATOCRIT, BLOOD
HCT: 28.3 % — ABNORMAL LOW (ref 36.0–46.0)
HCT: 25.9 % — ABNORMAL LOW (ref 36.0–46.0)
HCT: 27.1 % — ABNORMAL LOW (ref 36.0–46.0)
Hemoglobin: 9.2 g/dL — ABNORMAL LOW (ref 12.0–15.0)
Hemoglobin: 8.8 g/dL — ABNORMAL LOW (ref 12.0–15.0)
Hemoglobin: 9.2 g/dL — ABNORMAL LOW (ref 12.0–15.0)

## 2020-01-07 LAB — FERRITIN: Ferritin: 63 ng/mL (ref 11–307)

## 2020-01-07 LAB — RETICULOCYTES
Immature Retic Fract: 2.4 % (ref 2.3–15.9)
RBC.: 3.03 MIL/uL — ABNORMAL LOW (ref 3.87–5.11)
Retic Count, Absolute: 42.4 10*3/uL (ref 19.0–186.0)
Retic Ct Pct: 1.4 % (ref 0.4–3.1)

## 2020-01-07 LAB — ABO/RH: ABO/RH(D): O POS

## 2020-01-07 LAB — PROCALCITONIN: Procalcitonin: <0.1 ng/mL

## 2020-01-07 LAB — RESPIRATORY PANEL BY RT PCR (FLU A&B, COVID)
Influenza A by PCR: NEGATIVE
Influenza B by PCR: NEGATIVE
SARS Coronavirus 2 by RT PCR: NEGATIVE

## 2020-01-07 LAB — VITAMIN B12: Vitamin B-12: 1142 pg/mL — ABNORMAL HIGH (ref 180–914)

## 2020-01-07 LAB — APTT: aPTT: 30 seconds (ref 24–36)

## 2020-01-07 LAB — FOLATE: Folate: 25 ng/mL (ref >5.9)

## 2020-01-07 MED ORDER — FLUTICASONE PROPIONATE 50 MCG/ACT NA SUSP
2.0000 | Freq: Every day | NASAL | Status: DC
  Administered 2020-01-07 – 2020-01-08 (×2): 2 via NASAL
  Filled 2020-01-07: qty 16

## 2020-01-07 MED ORDER — HYDROCOD POLST-CPM POLST ER 10-8 MG/5ML PO SUER: 5.0000 mL | Freq: Two times a day (BID) | ORAL | Status: DC | PRN

## 2020-01-07 MED ORDER — ACETAMINOPHEN 325 MG PO TABS
650.0000 mg | ORAL_TABLET | Freq: Four times a day (QID) | ORAL | Status: DC | PRN
  Administered 2020-01-08: 650 mg via ORAL
  Filled 2020-01-07: qty 2

## 2020-01-07 MED ORDER — ONDANSETRON HCL 4 MG/2ML IJ SOLN
4.0000 mg | Freq: Once | INTRAMUSCULAR | Status: AC
  Administered 2020-01-07: 4 mg via INTRAVENOUS
  Filled 2020-01-07: qty 2

## 2020-01-07 MED ORDER — GUAIFENESIN ER 600 MG PO TB12
600.0000 mg | ORAL_TABLET | Freq: Two times a day (BID) | ORAL | Status: DC
  Administered 2020-01-07 – 2020-01-08 (×3): 600 mg via ORAL
  Filled 2020-01-07 (×3): qty 1

## 2020-01-07 MED ORDER — SODIUM CHLORIDE 0.9 % IV SOLN: INTRAVENOUS | Status: DC

## 2020-01-07 MED ORDER — SODIUM CHLORIDE 0.9 % IV SOLN: 10.0000 mL/h | Freq: Once | INTRAVENOUS | Status: DC

## 2020-01-07 MED ORDER — FENTANYL CITRATE (PF) 100 MCG/2ML IJ SOLN
50.0000 ug | Freq: Once | INTRAMUSCULAR | Status: AC
  Administered 2020-01-07: 50 ug via INTRAVENOUS
  Filled 2020-01-07: qty 2

## 2020-01-07 MED ORDER — MAGNESIUM HYDROXIDE 400 MG/5ML PO SUSP: 30.0000 mL | Freq: Every day | ORAL | Status: DC | PRN

## 2020-01-07 MED ORDER — VITAMIN B-12 1000 MCG PO TABS
1000.0000 ug | ORAL_TABLET | Freq: Every day | ORAL | Status: DC
  Administered 2020-01-07 – 2020-01-08 (×2): 1000 ug via ORAL
  Filled 2020-01-07 (×3): qty 1

## 2020-01-07 MED ORDER — ASCORBIC ACID 500 MG PO TABS
250.0000 mg | ORAL_TABLET | Freq: Every day | ORAL | Status: DC
  Administered 2020-01-07 – 2020-01-08 (×2): 250 mg via ORAL
  Filled 2020-01-07 (×2): qty 1

## 2020-01-07 MED ORDER — ONDANSETRON HCL 4 MG/2ML IJ SOLN
4.0000 mg | Freq: Four times a day (QID) | INTRAMUSCULAR | Status: DC | PRN
  Administered 2020-01-08: 4 mg via INTRAVENOUS
  Filled 2020-01-07: qty 2

## 2020-01-07 MED ORDER — CALCIUM CARBONATE ANTACID 500 MG PO CHEW: 1.0000 | CHEWABLE_TABLET | Freq: Two times a day (BID) | ORAL | Status: DC | PRN

## 2020-01-07 MED ORDER — ONDANSETRON HCL 4 MG PO TABS: 4.0000 mg | ORAL_TABLET | Freq: Four times a day (QID) | ORAL | Status: DC | PRN

## 2020-01-07 MED ORDER — ACETAMINOPHEN 650 MG RE SUPP: 650.0000 mg | Freq: Four times a day (QID) | RECTAL | Status: DC | PRN

## 2020-01-07 MED ORDER — SENNOSIDES-DOCUSATE SODIUM 8.6-50 MG PO TABS: 1.0000 | ORAL_TABLET | Freq: Every evening | ORAL | Status: DC | PRN

## 2020-01-07 MED ORDER — TRAZODONE HCL 50 MG PO TABS: 25.0000 mg | ORAL_TABLET | Freq: Every evening | ORAL | Status: DC | PRN

## 2020-01-07 MED ORDER — ENOXAPARIN SODIUM 40 MG/0.4ML ~~LOC~~ SOLN: 40.0000 mg | SUBCUTANEOUS | Status: DC

## 2020-01-07 NOTE — ED Notes (Signed)
ED Provider at bedside. 

## 2020-01-07 NOTE — Consult Note (Addendum)
Subjective:   CC: hemoperitoneum  HPI:  Jamie Foley is a 64 y.o. female who was consulted by Don Perking for evaluation of above.  First noted 1 day ago.  Symptoms include: Pain is sharp, sudden, at night.  Exacerbated by nothing specific.  Alleviated by nothing specific.  Associated with N/V, not stool or urinary changes.  Pain has since improved, but still present, mostly in LUQ.   Past Medical History:  has a past medical history of Anemia, Arthritis, Complication of anesthesia, Osteoporosis, Polycystic kidney disease, and Renal insufficiency.  Past Surgical History:  has a past surgical history that includes Cholecystectomy; Abdominal hysterectomy; right ankle fracture (Right); Hernia repair; Cataract extraction w/PHACO (Left, 10/14/2014); Eye surgery (Left); Inguinal hernia repair (Left, 08/03/2016); Ventral hernia repair (N/A, 08/03/2016); and Esophagogastroduodenoscopy (egd) with propofol (N/A, 12/01/2018).  Family History: family history includes Heart attack (age of onset: 8) in her father.  Social History:  reports that she has never smoked. She has never used smokeless tobacco. She reports that she does not drink alcohol and does not use drugs.  Current Medications:  Prior to Admission medications   Medication Sig Start Date End Date Taking? Authorizing Provider  ascorbic acid (VITAMIN C) 500 MG tablet Take 250 mg by mouth daily.   Yes [provider]  calcium carbonate (TUMS - DOSED IN MG ELEMENTAL CALCIUM) 500 MG chewable tablet Chew 1 tablet by mouth 2 (two) times daily as needed for indigestion or heartburn.   Yes [provider]  chlorpheniramine-HYDROcodone (TUSSIONEX) 10-8 MG/5ML SUER Take 5 mLs by mouth every 12 (twelve) hours as needed for cough. 12/17/19  Yes [provider]  cyanocobalamin 1000 MCG tablet Take by mouth.   Yes [provider]  fluticasone (FLONASE) 50 MCG/ACT nasal spray Place 2 sprays into both nostrils daily. 12/17/19  Yes  [provider]  senna-docusate (SENOKOT-S) 8.6-50 MG tablet Take by mouth.  09/27/18  Yes [provider]    Allergies:  Allergies  Allergen Reactions  . Augmentin [Amoxicillin-Pot Clavulanate] Nausea Only and Other (See Comments)    Stomach pain   . Levofloxacin Other (See Comments)  . Prednisone Nausea And Vomiting and Other (See Comments)    Doubled over with stomach pain and couldn't sleep    ROS:  General: Denies weight loss, weight gain, fatigue, fevers, chills, and night sweats. Eyes: Denies blurry vision, double vision, eye pain, itchy eyes, and tearing. Ears: Denies hearing loss, earache, and ringing in ears. Nose: Denies sinus pain, congestion, infections, runny nose, and nosebleeds. Mouth/throat: Denies hoarseness, sore throat, bleeding gums, and difficulty swallowing. Heart: Denies chest pain, palpitations, racing heart, irregular heartbeat, leg pain or swelling, and decreased activity tolerance. Respiratory: Denies breathing difficulty, shortness of breath, wheezing, cough, and sputum. GI: Denies change in appetite, heartburn, constipation, diarrhea, and blood in stool. GU: Denies difficulty urinating, pain with urinating, urgency, frequency, blood in urine. Musculoskeletal: Denies joint stiffness, pain, swelling, muscle weakness. Skin: Denies rash, itching, mass, tumors, sores, and boils Neurologic: Denies headache, fainting, dizziness, seizures, numbness, and tingling. Psychiatric: Denies depression, anxiety, difficulty sleeping, and memory loss. Endocrine: Denies heat or cold intolerance, and increased thirst or urination. Blood/lymph: Denies easy bruising, easy bruising, and swollen glands     Objective:     BP (!) 116/91   Pulse 87   Temp 98.7 F (37.1 C) (Oral)   Resp (!) 27   Ht 5\' 5"  (1.651 m)   Wt 55 kg   SpO2 98%   BMI  20.18 kg/m   Constitutional :  alert, cooperative, appears stated age and no distress  Lymphatics/Throat:  no  asymmetry, masses, or scars  Respiratory:  clear to auscultation bilaterally  Cardiovascular:  regular rate and rhythm  Gastrointestinal: soft, no guarding, TTP in LUQ, states it radiating around to left flank. some suprapubic tenderness noted on exam but not to same degree as LUQ pain.  Musculoskeletal: Steady gait and movement  Skin: Cool and moist  Psychiatric: Normal affect, non-agitated, not confused       LABS:  CMP Latest Ref Rng & Units 01/06/2020 10/23/2019 04/10/2019  Glucose 70 - 99 mg/dL 188(C) 94 90  BUN 8 - 23 mg/dL 16(S) 06(T) 01(S)  Creatinine 0.44 - 1.00 mg/dL 0.10(X) 3.23(F) 5.73(U)  Sodium 135 - 145 mmol/L 138 140 139  Potassium 3.5 - 5.1 mmol/L 4.1 4.1 4.2  Chloride 98 - 111 mmol/L 102 106 105  CO2 22 - 32 mmol/L 24 27 27   Calcium 8.9 - 10.3 mg/dL 9.3 ) 9.4  Total Protein 6.5 - 8.1 g/dL 6.9 7.2 7.3  Total Bilirubin 0.3 - 1.2 mg/dL 0.9 1.0 0.8  Alkaline Phos 38 - 126 U/L 38 46 49  AST 15 - 41 U/L 16 14(L) 16  ALT 0 - 44 U/L 11 11 14    CBC Latest Ref Rng & Units 01/07/2020 01/07/2020 01/06/2020  WBC 4.0 - 10.5 K/uL - - 8.8  Hemoglobin 12.0 - 15.0 g/dL 03/08/2020) 03/07/2020) 10.0(L)  Hematocrit 36 - 46 % 25.9(L) 28.3(L) 31.2(L)  Platelets 150 - 400 K/uL - - 174    RADS: CLINICAL DATA:  Abdominal distension  EXAM: CT ABDOMEN AND PELVIS WITHOUT CONTRAST  TECHNIQUE: Multidetector CT imaging of the abdomen and pelvis was performed following the standard protocol without IV contrast.  COMPARISON:  September 20, 2018  FINDINGS: Lower chest: The visualized heart size within normal limits. No pericardial fluid/thickening.  No hiatal hernia.  The visualized portions of the lungs are clear.  Hepatobiliary: Again noted are multiple low-density is seen throughout the liver parenchyma the largest in the anterior left liver lobe measuring 4.7 cm. The patient is status post cholecystectomy. No evidence of calcified gallstones or biliary ductal  dilatation.  Pancreas:  Unremarkable.  No surrounding inflammatory changes.  Spleen: There is a heterogeneously hyperdense area seen within the posterior spleen which could represent a perisplenic hematoma.  Adrenals/Urinary Tract: Both adrenal glands appear normal. Again noted are multiple low-density the cysts seen throughout both kidneys. Several which contain calcifications. There is also hyperdense material seen within several of the cysts seen within the lower pole of the right kidney which could represent proteinaceous/hemorrhage. Again noted is enlargement of the right kidney. The bladder is unremarkable.  Stomach/Bowel: The stomach, small bowel, and colon are normal in appearance. No inflammatory changes or obstructive findings. appendix is normal.  Vascular/Lymphatic: There are no enlarged abdominal or pelvic lymph nodes. Scattered aortic atherosclerotic calcifications are seen without aneurysmal dilatation.  Reproductive: The patient is status post hysterectomy. No adnexal masses or collections seen.  Other: Hemoperitoneum is seen within the deep pelvis.  Musculoskeletal: No acute or significant osseous findings.  IMPRESSION: Heterogeneously hyperdense area within the posterior spleen, concerning for perisplenic hematoma.  Small amount of hemoperitoneum in the deep pelvis.  Again noted are numerous hepatic and bilateral renal cysts. Some of the renal cysts appear to have hyperdense material as on prior exam which could represent hemorrhage or proteinaceous material.  Aortic Atherosclerosis (ICD10-I70.0).   Electronically Signed  By: Jonna Clark M.D.   On: 01/07/2020 03:36  Assessment:      Hemoperitoneum noted on CT with extensive PCKD, likely mult hemorrhagic cysts/unknown duration.  New splenic hematoma compared to prior CT couple years ago.  Plan:    Pt otherwise stable for now, pain under control, but hgb did drop slightly again  this pm.  Will repeat CT to see any change in size of hematoma.  If increasing, recommend vasc surg consult to see if they have anyway to proceed with angiogram with minimal stress on kidneys.  Continue current management in the meantime.  ADDENDUM:  CT shows no worsening blood collections around spleen, decreased in pelvis.  she can probably go home with close outpt f/u with [facility].  not sure if there is anything they can do to prevent another episode, but recommend she goes to tertiary center if she redevelops symptoms due to complicated nature of surgical intervention that maybe needed at that time.

## 2020-01-07 NOTE — Evaluation (Signed)
Occupational Therapy Evaluation Patient Details Name: ANAYA BOVEE MRN: [id number] DOB: 1955-07-08 Today's Date: 01/07/2020    History of Present Illness 64 year old Caucasian female with past medical history notable for polycystic kidney disease, anemia of chronic disease, osteoarthritis, CKD stage IIIb who presented to [facility] emergency department with acute onset abdominal pain.  Patient reported onset of symptoms 4 PM localized to the right mid abdomen with radiation towards her left upper/lower quadrant.  Also with associated nausea/vomiting and subjective fevers and chills.  Further, patient reports cough over the past 4 weeks in which she completed a course of amoxicillin without significant improvement.   Clinical Impression   Patient reports feeling "very close" to baseline. Pt is overall supervision for self care tasks and functional transfers without use of AD this session. Pt reports soreness in L rib area from coughing and has felt nauseated. Pt also reports she has not slept yet. Pt is independent with self care at home, drives, and completes IADL tasks independently. She lives with her husband and assists her 2 sisters with being a caregiver to their mother( has dementia- does not need physical assist). Pt does not have skilled need of occupational therapy intervention. Pt verbalized understanding and agreement. OT to SIGN OFF at this time. Thank you for referral.     Follow Up Recommendations  No OT follow up    Equipment Recommendations  None recommended by OT    Recommendations for Other Services Other (comment) (none at this time)     Precautions / Restrictions Precautions Precautions: Fall      Mobility Bed Mobility Overal bed mobility: Modified Independent      Transfers Overall transfer level: Needs assistance Equipment used: None Transfers: Sit to/from [employer] Sit to Stand: Supervision Stand pivot transfers: Supervision        General transfer comment: supervision for safety    Balance Overall balance assessment: History of Falls;Mild deficits observed, not formally tested            ADL either performed or assessed with clinical judgement   ADL Overall ADL's : Needs assistance/impaired           General ADL Comments: Pt with supervision overall this session without use of AD for functional transfers, short distance ambulation, and demonstration of self care tasks.     Vision Baseline Vision/History: Wears glasses Wears Glasses: At all times Patient Visual Report: No change from baseline              Pertinent Vitals/Pain Pain Assessment: 0-10 Pain Score: 3  Pain Location: abdomen Pain Descriptors / Indicators: Discomfort Pain Intervention(s): Monitored during session;Limited activity within patient's tolerance;Repositioned     Hand Dominance Right   Extremity/Trunk Assessment Upper Extremity Assessment Upper Extremity Assessment: Overall WFL for tasks assessed (Pt reports soreness with raising L UE near ribs from "coughing so much")   Lower Extremity Assessment Lower Extremity Assessment: Defer to PT evaluation   Cervical / Trunk Assessment Cervical / Trunk Assessment: Normal   Communication Communication Communication: No difficulties   Cognition Arousal/Alertness: Awake/alert Behavior During Therapy: WFL for tasks assessed/performed Overall Cognitive Status: Within Functional Limits for tasks assessed        General Comments  supervision overall for safety but nio LOB noted during this session without use of AD            Home Living Family/patient expects to be discharged to:: Private residence Living Arrangements: Spouse/significant other Available Help at Discharge: Family;Friend(s);Available PRN/intermittently Type  of Home: House Home Access: Stairs to enter [employer] of Steps: 2 Entrance Stairs-Rails: Right Home Layout: One level     Bathroom  Shower/Tub: Walk-in shower         Home Equipment: Toilet riser   Additional Comments: 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 She is caregiver for her mother who does not need physical assist.                 OT Goals(Current goals can be found in the care plan section) Acute Rehab OT Goals Patient Stated Goal: to go home OT Goal Formulation: With patient Time For Goal Achievement: 01/21/20 Potential to Achieve Goals: Good   AM-PAC OT "6 Clicks" Daily Activity     Outcome Measure Help from another person eating meals?: None Help from another person taking care of personal grooming?: None Help from another person toileting, which includes using toliet, bedpan, or urinal?: None Help from another person bathing (including washing, rinsing, drying)?: None Help from another person to put on and taking off regular upper body clothing?: None Help from another person to put on and taking off regular lower body clothing?: None 6 Click Score: 24   End of Session    Activity Tolerance: Patient tolerated treatment well Patient left: in bed;with call bell/phone within reach                   Time: 2229-7989 OT Time Calculation (min): 12 min Charges:  OT General Charges $OT Visit: 1 Visit OT Evaluation $OT Eval Low Complexity: 1 Low  Jackquline Denmark, MS, OTR/L , CBIS ascom [phone]  01/07/20, 10:58 AM

## 2020-01-07 NOTE — Progress Notes (Addendum)
PROGRESS NOTE    Jamie Foley  ZLD:[id number] DOB: 26-Jul-1955 DOA: 01/07/2020 PCP: Jerl Mina, MD    Brief Narrative:  Jamie Foley is a 64 year old Caucasian female with past medical history notable for polycystic kidney disease, anemia of chronic disease, osteoarthritis, CKD stage IIIb who presented to [facility] emergency department with acute onset abdominal pain.  Patient reported onset of symptoms 4 PM localized to the right mid abdomen with radiation towards her left upper/lower quadrant.  Also with associated nausea/vomiting and subjective fevers and chills.  Further, patient reports cough over the past 4 weeks in which she completed a course of amoxicillin without significant improvement.  In the ED, BP 157/113, HR 101, creatinine 1.86, hemoglobin 10.0, INR 1.1.  Urinalysis unrevealing.  Covid-19 PCR negative.  Chest x-ray with no acute cardiopulmonary disease process.  CT abdomen/pelvis notable for heterogeneously hypodense area within the posterior spleen concerning for perisplenic hematoma and small amount of hemoperitoneum deep pelvis with numerous hepatic and renal cysts with some of the renal cyst with hyperdense material that could be consistent with hemorrhage/proteinaceous material.  General surgery was consulted.  Hospitalist service was consulted for admission in regards to further evaluation and treatment.   Assessment & Plan:   Principal Problem:   Hemoperitoneum Active Problems:   PKD (polycystic kidney disease)   Anemia   Abdominal pain   Perisplenic hematoma   Acute abdominal pain secondary to hemoperitoneum Patient presenting to the ED with acute onset abdominal pain with radiation to right upper/lower quadrant.  Patient is afebrile without leukocytosis, unlikely infectious etiology.  CT abdomen/pelvis concerning for perisplenic hematoma and hemoperitoneum.  Patient with underlying polycystic kidney disease with concern for possible hemorrhage within her  cysts. --Hemoglobin currently stable, continue monitor H/H q8h --Serial abdominal exams, if worsening will need repeat CT abdomen/pelvis for evaluation of interval change --Await input from general surgery colleagues, Dr.  Tonna Boehringer consulted overnight  Acute renal failure on CKD stage IIIb Baseline creatinine 1.6-1.7.  With a GFR 30-32.  On admission, patient's creatinine 1.86 6 with a GFR of 28.  Follows with [facility], Dr. Imagene Gurney.  Reviewed previous clinic note, last dated 11/13/2018.  Given her PCKD, eventually will likely lead to ESRD requiring RRT. --Avoid nephrotoxins, renally dose all medications --NS at 100 mL/h --To monitor creatinine closely while inpatient --Outpatient follow-up with nephrology  Anemia Iron panel June 2020 with iron 7, TIBC 173 (low), ferritin 255, B12 05/09/2003, folate 46.0.;  Likely multifactorial in the setting of iron deficiency from her hemorrhagic cysts as well as prior noted B12 deficiency.. --Repeat iron panel --Hemoglobin 10.0>9.2; stable --Monitor H/H q8h, especially in the setting of hemoperitoneum as above --Transfuse for active bleeding or hemoglobin < 7.0  Polycystic kidney disease Follows with [facility] nephrology, Dr. Imagene Gurney.  Last visit noted in EMR 11/13/2018. --Continue outpatient follow-up  Recent fall at home --PT/OT evaluation  Cough Was recently prescribed amoxicillin without any appreciable change.  Covid-19 PCR negative.  Chest x-ray with no acute cardiopulmonary disease process. --Mucinex twice daily --Flonase  DVT prophylaxis: SCDs, holding chemical DVT prophylaxis in the setting of hemoperitoneum Code Status: Full code Family Communication: Updated patient extensively at bedside  Disposition Plan:  Status is: Inpatient  Remains inpatient appropriate because:Ongoing diagnostic testing needed not appropriate for outpatient work up, Unsafe d/c plan, IV treatments appropriate due to  intensity of illness or inability to take PO and Inpatient level of care appropriate due to severity of illness  Dispo: The patient is from: Home              Anticipated d/c is to: Home              Anticipated d/c date is: 2 days              Patient currently is not medically stable to d/c.   Consultants:   General surgery  Procedures:   none  Antimicrobials:   None   Subjective: Patient seen and examined at bedside, continues in ED holding area.  Continues with mild mid abdomen abdominal pain with radiation to left upper/lower quadrant and left flank.  Continues with nausea.  Also with cough.  Generalized ill feeling.  No other complaints or concerns at this time.  Denies headache, no current fever/chills, no night sweats, no vomiting/diarrhea, no chest pain, palpitations, no shortness of breath, no weakness, no fatigue, no paresthesias.  No acute events overnight per nursing staff.  Objective: Vitals:   01/06/20 1755 01/06/20 2312 01/07/20 0330 01/07/20 0930  BP:  (!) 133/99 (!) 171/99 (!) 135/98  Pulse:  98 98 99  Resp:  20 18 20   Temp:  98.1 F (36.7 C)    TempSrc:  Oral    SpO2:  99% 97% 100%  Weight: 55 kg     Height: 5\' 5"  (1.651 m)      No intake or output data in the 24 hours ending 01/07/20 1008 Filed Weights   01/06/20 1755  Weight: 55 kg    Examination:  General exam: Appears calm and comfortable  Respiratory system: Clear to auscultation. Respiratory effort normal.  Oxygen well on room air Cardiovascular system: S1 & S2 heard, RRR. No JVD, murmurs, rubs, gallops or clicks.  Trace pedal edema. Gastrointestinal system: Abdomen soft, nondistended with mild TTP mid abdomen/periumbilical region that is more exquisitely tender to left lower quadrant/left upper quadrant. No organomegaly or masses felt. Normal bowel sounds heard. Central nervous system: Alert and oriented. No focal neurological deficits. Extremities: Symmetric 5 x 5 power. Skin: Ecchymosis  noted lateral foot with hematoma/ecchymosis anterior shin, otherwise no other notable lesions, rashes or wounds Psychiatry: Judgement and insight appear normal. Mood & affect appropriate.     Data Reviewed: I have personally reviewed following labs and imaging studies  CBC: Recent Labs  Lab 01/06/20 1757 01/07/20 0423  WBC 8.8  --   HGB 10.0* 9.2*  HCT 31.2* 28.3*  MCV 86.7  --   PLT 174  --    Basic Metabolic Panel: Recent Labs  Lab 01/06/20 1757  NA 138  K 4.1  CL 102  CO2 24  GLUCOSE 129*  BUN 30*  CREATININE 1.86*  CALCIUM 9.3   GFR: Estimated Creatinine Clearance: 26.5 mL/min (A) (by C-G formula based on SCr of 1.86 mg/dL (H)). Liver Function Tests: Recent Labs  Lab 01/06/20 1757  AST 16  ALT 11  ALKPHOS 38  BILITOT 0.9  PROT 6.9  ALBUMIN 3.8   Recent Labs  Lab 01/06/20 1757  LIPASE 31   No results for input(s): AMMONIA in the last 168 hours. Coagulation Profile: Recent Labs  Lab 01/07/20 0423  INR 1.1   Cardiac Enzymes: No results for input(s): CKTOTAL, CKMB, CKMBINDEX, TROPONINI in the last 168 hours. BNP (last 3 results) No results for input(s): PROBNP in the last 8760 hours. HbA1C: No results for input(s): HGBA1C in the last 72 hours. CBG: No results for input(s): GLUCAP in the last 168 hours. Lipid  Profile: No results for input(s): CHOL, HDL, LDLCALC, TRIG, CHOLHDL, LDLDIRECT in the last 72 hours. Thyroid Function Tests: No results for input(s): TSH, T4TOTAL, FREET4, T3FREE, THYROIDAB in the last 72 hours. Anemia Panel: No results for input(s): VITAMINB12, FOLATE, FERRITIN, TIBC, IRON, RETICCTPCT in the last 72 hours. Sepsis Labs: No results for input(s): PROCALCITON, LATICACIDVEN in the last 168 hours.  Recent Results (from the past 240 hour(s))  Respiratory Panel by RT PCR (Flu A&B, Covid) - Nasopharyngeal Swab     Status: None   Collection Time: 01/07/20  4:36 AM   Specimen: Nasopharyngeal Swab  Result Value Ref Range Status    SARS Coronavirus 2 by RT PCR NEGATIVE NEGATIVE Final    Comment: (NOTE) SARS-CoV-2 target nucleic acids are NOT DETECTED.  The SARS-CoV-2 RNA is generally detectable in upper respiratoy specimens during the acute phase of infection. The lowest concentration of SARS-CoV-2 viral copies this assay can detect is 131 copies/mL. A negative result does not preclude SARS-Cov-2 infection and should not be used as the sole basis for treatment or other patient management decisions. A negative result may occur with  improper specimen collection/handling, submission of specimen other than nasopharyngeal swab, presence of viral mutation(s) within the areas targeted by this assay, and inadequate number of viral copies (<131 copies/mL). A negative result must be combined with clinical observations, patient history, and epidemiological information. The expected result is Negative.  Fact Sheet for Patients:  https://www.moore.com/  Fact Sheet for Healthcare Providers:  https://www.young.biz/  This test is no t yet approved or cleared by the [place] FDA and  has been authorized for detection and/or diagnosis of SARS-CoV-2 by FDA under an Emergency Use Authorization (EUA). This EUA will remain  in effect (meaning this test can be used) for the duration of the COVID-19 declaration under Section 564(b)(1) of the Act, 21 U.S.C. section 360bbb-3(b)(1), unless the authorization is terminated or revoked sooner.     Influenza A by PCR NEGATIVE NEGATIVE Final   Influenza B by PCR NEGATIVE NEGATIVE Final    Comment: (NOTE) The Xpert Xpress SARS-CoV-2/FLU/RSV assay is intended as an aid in  the diagnosis of influenza from Nasopharyngeal swab specimens and  should not be used as a sole basis for treatment. Nasal washings and  aspirates are unacceptable for Xpert Xpress SARS-CoV-2/FLU/RSV  testing.  Fact Sheet for  Patients: https://www.moore.com/  Fact Sheet for Healthcare Providers: https://www.young.biz/  This test is not yet approved or cleared by the [place] FDA and  has been authorized for detection and/or diagnosis of SARS-CoV-2 by  FDA under an Emergency Use Authorization (EUA). This EUA will remain  in effect (meaning this test can be used) for the duration of the  Covid-19 declaration under Section 564(b)(1) of the Act, 21  U.S.C. section 360bbb-3(b)(1), unless the authorization is  terminated or revoked. Performed at [facility], [address] [place], [place] [address]          Radiology Studies: CT ABDOMEN PELVIS WO CONTRAST  Result Date: 01/07/2020 CLINICAL DATA:  Abdominal distension EXAM: CT ABDOMEN AND PELVIS WITHOUT CONTRAST TECHNIQUE: Multidetector CT imaging of the abdomen and pelvis was performed following the standard protocol without IV contrast. COMPARISON:  September 20, 2018 FINDINGS: Lower chest: The visualized heart size within normal limits. No pericardial fluid/thickening. No hiatal hernia. The visualized portions of the lungs are clear. Hepatobiliary: Again noted are multiple low-density is seen throughout the liver parenchyma the largest in the anterior left liver lobe measuring 4.7  cm. The patient is status post cholecystectomy. No evidence of calcified gallstones or biliary ductal dilatation. Pancreas:  Unremarkable.  No surrounding inflammatory changes. Spleen: There is a heterogeneously hyperdense area seen within the posterior spleen which could represent a perisplenic hematoma. Adrenals/Urinary Tract: Both adrenal glands appear normal. Again noted are multiple low-density the cysts seen throughout both kidneys. Several which contain calcifications. There is also hyperdense material seen within several of the cysts seen within the lower pole of the right kidney which could represent proteinaceous/hemorrhage. Again  noted is enlargement of the right kidney. The bladder is unremarkable. Stomach/Bowel: The stomach, small bowel, and colon are normal in appearance. No inflammatory changes or obstructive findings. appendix is normal. Vascular/Lymphatic: There are no enlarged abdominal or pelvic lymph nodes. Scattered aortic atherosclerotic calcifications are seen without aneurysmal dilatation. Reproductive: The patient is status post hysterectomy. No adnexal masses or collections seen. Other: Hemoperitoneum is seen within the deep pelvis. Musculoskeletal: No acute or significant osseous findings. IMPRESSION: Heterogeneously hyperdense area within the posterior spleen, concerning for perisplenic hematoma. Small amount of hemoperitoneum in the deep pelvis. Again noted are numerous hepatic and bilateral renal cysts. Some of the renal cysts appear to have hyperdense material as on prior exam which could represent hemorrhage or proteinaceous material. Aortic Atherosclerosis (ICD10-I70.0). Electronically Signed   By: Jonna ClarkBindu  Avutu M.D.   On: 01/07/2020 03:36   DG Chest 2 View  Result Date: 01/06/2020 CLINICAL DATA:  Abdominal pain. EXAM: CHEST - 2 VIEW COMPARISON:  September 23, 2018 FINDINGS: The heart size and mediastinal contours are within normal limits. Both lungs are clear. Radiopaque surgical clips are seen overlying the right upper quadrant. The visualized skeletal structures are unremarkable. IMPRESSION: No active cardiopulmonary disease. Electronically Signed   By: Aram Candelahaddeus  Houston M.D.   On: 01/06/2020 22:22        Scheduled Meds: . ascorbic acid  250 mg Oral Daily  . fluticasone  2 spray Each Nare Daily  . guaiFENesin  600 mg Oral BID  . cyanocobalamin  1,000 mcg Oral Daily   Continuous Infusions: . sodium chloride 100 mL/hr at 01/07/20 0819     LOS: 0 days    Time spent: 35 minutes spent on chart review, discussion with nursing staff, consultants, updating family and interview/physical exam; more than 50%  of that time was spent in counseling and/or coordination of care.    Alvira PhilipsEric J [place], DO [facility] Available via Epic secure chat 7am-7pm After these hours, please refer to coverage provider listed on [email or web address] 01/07/2020, 10:08 AM

## 2020-01-07 NOTE — ED Provider Notes (Signed)
[facility] Emergency Department Provider Note  ____________________________________________  Time seen: Approximately 2:40 AM  I have reviewed the triage vital signs and the nursing notes.   HISTORY  Chief Complaint Abdominal Pain   HPI Jamie Foley is a 64 y.o. female with a history of anemia, polycystic kidney disease, chronic kidney disease, hyperlipidemia, constipation who presents for evaluation of abdominal pain.  Patient reports the pain started about 12 hours ago.  The pain is diffuse, squeezing in quality, intermittent, associated with nausea and one episode of nonbloody nonbilious emesis.  Last bowel movement was yesterday.  No diarrhea, no constipation.  Patient does report that her abdomen is distended and she feels bloated.  She is passing gas.  No prior history of SBO.  She has had several abdominal surgeries including hysterectomy, cholecystectomy, 2 hernia repairs and C-sections.  She denies dysuria or hematuria.  At this time she reports that her pain is mild.   Past Medical History:  Diagnosis Date  . Anemia   . Arthritis   . Complication of anesthesia    HARD TO WAKE UP AFTER ANKLE SURGERY-FELT LIKE SHE RECEIVED TO MUCH ANESTHESIA  . Osteoporosis   . Polycystic kidney disease   . Renal insufficiency     Patient Active Problem List   Diagnosis Date Noted  . Anemia   . HLD (hyperlipidemia) 11/02/2018  . Neck pain 11/02/2018  . PKD (polycystic kidney disease) 11/02/2018  . Constipation 09/27/2018  . Epigastric pain 09/27/2018  . Gastroenteritis 09/20/2018    Past Surgical History:  Procedure Laterality Date  . ABDOMINAL HYSTERECTOMY    . CATARACT EXTRACTION W/PHACO Left 10/14/2014   Procedure: CATARACT EXTRACTION PHACO AND INTRAOCULAR LENS PLACEMENT (IOC);  Surgeon: Sallee LangeSteven Dingeldein, MD;  Location: [facility] ORS;  Service: Ophthalmology;  Laterality: Left;  US 01:09 AP% 60.0 CDE 37.21 fluid pack lot #[id number]  .  CHOLECYSTECTOMY    . ESOPHAGOGASTRODUODENOSCOPY (EGD) WITH PROPOFOL N/A 12/01/2018   Procedure: ESOPHAGOGASTRODUODENOSCOPY (EGD) WITH PROPOFOL;  Surgeon: Toney ReilVanga, Rohini Reddy, MD;  [facility];  Service: Gastroenterology;  Laterality: N/A;  . EYE SURGERY Left    DETACHED RETINA   . HERNIA REPAIR    . INGUINAL HERNIA REPAIR Left 08/03/2016   Procedure: HERNIA REPAIR INGUINAL ADULT;  Surgeon: Nadeen LandauSmith, Jarvis Wilton, MD;  Location: [facility] ORS;  Service: General;  Laterality: Left;  . right ankle fracture Right   . VENTRAL HERNIA REPAIR N/A 08/03/2016   Procedure: HERNIA REPAIR VENTRAL ADULT;  Surgeon: Nadeen LandauSmith, Jarvis Wilton, MD;  Location: [facility] ORS;  Service: General;  Laterality: N/A;    Prior to Admission medications   Medication Sig Start Date End Date Taking? Authorizing Provider  cyanocobalamin 1000 MCG tablet Take by mouth.    [provider]  loperamide (IMODIUM) 2 MG capsule Take 1 capsule (2 mg total) by mouth as needed for diarrhea or loose stools. Patient not taking: Reported on 10/23/2019 09/24/18   Mayo, Allyn KennerKaty Dodd, MD  ondansetron (ZOFRAN) 4 MG tablet Take 1 tablet (4 mg total) by mouth every 6 (six) hours as needed for nausea. Patient not taking: Reported on 10/23/2019 09/24/18   MayoAllyn Kenner, Katy Dodd, MD  senna-docusate (SENOKOT-S) 8.6-50 MG tablet Take by mouth. Patient not taking: Reported on 10/23/2019 09/27/18   [provider]    Allergies Augmentin [amoxicillin-pot clavulanate], Levofloxacin, and Prednisone  Family History  Problem Relation Age of Onset  . Heart attack Father 9670    Social History Social History   Tobacco  Use  . Smoking status: Never Smoker  . Smokeless tobacco: Never Used  Substance Use Topics  . Alcohol use: No  . Drug use: No    Review of Systems  Constitutional: Negative for fever. Eyes: Negative for visual changes. ENT: Negative for sore throat. Neck: No neck pain  Cardiovascular: Negative for chest pain. Respiratory:  Negative for shortness of breath. Gastrointestinal: + abdominal pain, nausea, vomiting, bloating. No diarrhea. Genitourinary: Negative for dysuria. Musculoskeletal: Negative for back pain. Skin: Negative for rash. Neurological: Negative for headaches, weakness or numbness. Psych: No SI or HI  ____________________________________________   PHYSICAL EXAM:  VITAL SIGNS: Vitals:   01/06/20 2312 01/07/20 0330  BP: (!) 133/99 (!) 171/99  Pulse: 98 98  Resp: 20 18  Temp: 98.1 F (36.7 C)   SpO2: 99% 97%    Constitutional: Alert and oriented. Well appearing and in no apparent distress. HEENT:      Head: Normocephalic and atraumatic.         Eyes: Conjunctivae are normal. Sclera is non-icteric.       Mouth/Throat: Mucous membranes are moist.       Neck: Supple with no signs of meningismus. Cardiovascular: Regular rate and rhythm. No murmurs, gallops, or rubs. 2+ symmetrical distal pulses are present in all extremities. No JVD. Respiratory: Normal respiratory effort. Lungs are clear to auscultation bilaterally. No wheezes, crackles, or rhonchi.  Gastrointestinal: Soft, diffusely tender to palpation, mildly distended with positive bowel sounds. No rebound or guarding. Musculoskeletal: No edema, cyanosis, or erythema of extremities. Neurologic: Normal speech and language. Face is symmetric. Moving all extremities. No gross focal neurologic deficits are appreciated. Skin: Skin is warm, dry and intact. No rash noted. Psychiatric: Mood and affect are normal. Speech and behavior are normal.  ____________________________________________   LABS (all labs ordered are listed, but only abnormal results are displayed)  Labs Reviewed  COMPREHENSIVE METABOLIC PANEL - Abnormal; Notable for the following components:      Result Value   Glucose, Bld 129 (*)    BUN 30 (*)    Creatinine, Ser 1.86 (*)    GFR calc non Af Amer 28 (*)    GFR calc Af Amer 33 (*)    All other components within normal  limits  CBC - Abnormal; Notable for the following components:   RBC 3.60 (*)    Hemoglobin 10.0 (*)    HCT 31.2 (*)    All other components within normal limits  URINALYSIS, COMPLETE (UACMP) WITH MICROSCOPIC - Abnormal; Notable for the following components:   Color, Urine STRAW (*)    APPearance CLEAR (*)    Leukocytes,Ua TRACE (*)    Bacteria, UA RARE (*)    All other components within normal limits  RESPIRATORY PANEL BY RT PCR (FLU A&B, COVID)  LIPASE, BLOOD  HEMOGLOBIN AND HEMATOCRIT, BLOOD  APTT  PROTIME-INR  TYPE AND SCREEN   ____________________________________________  EKG  ED ECG REPORT I, Nita Sickle, the attending physician, personally viewed and interpreted this ECG.  Normal sinus rhythm, rate 90, normal intervals, normal axis, no ST elevations or depressions. ____________________________________________  RADIOLOGY  I have personally reviewed the images performed during this visit and I agree with the Radiologist's read.   Interpretation by Radiologist:  CT ABDOMEN PELVIS WO CONTRAST  Result Date: 01/07/2020 CLINICAL DATA:  Abdominal distension EXAM: CT ABDOMEN AND PELVIS WITHOUT CONTRAST TECHNIQUE: Multidetector CT imaging of the abdomen and pelvis was performed following the standard protocol without IV contrast. COMPARISON:  September 20, 2018 FINDINGS: Lower chest: The visualized heart size within normal limits. No pericardial fluid/thickening. No hiatal hernia. The visualized portions of the lungs are clear. Hepatobiliary: Again noted are multiple low-density is seen throughout the liver parenchyma the largest in the anterior left liver lobe measuring 4.7 cm. The patient is status post cholecystectomy. No evidence of calcified gallstones or biliary ductal dilatation. Pancreas:  Unremarkable.  No surrounding inflammatory changes. Spleen: There is a heterogeneously hyperdense area seen within the posterior spleen which could represent a perisplenic hematoma.  Adrenals/Urinary Tract: Both adrenal glands appear normal. Again noted are multiple low-density the cysts seen throughout both kidneys. Several which contain calcifications. There is also hyperdense material seen within several of the cysts seen within the lower pole of the right kidney which could represent proteinaceous/hemorrhage. Again noted is enlargement of the right kidney. The bladder is unremarkable. Stomach/Bowel: The stomach, small bowel, and colon are normal in appearance. No inflammatory changes or obstructive findings. appendix is normal. Vascular/Lymphatic: There are no enlarged abdominal or pelvic lymph nodes. Scattered aortic atherosclerotic calcifications are seen without aneurysmal dilatation. Reproductive: The patient is status post hysterectomy. No adnexal masses or collections seen. Other: Hemoperitoneum is seen within the deep pelvis. Musculoskeletal: No acute or significant osseous findings. IMPRESSION: Heterogeneously hyperdense area within the posterior spleen, concerning for perisplenic hematoma. Small amount of hemoperitoneum in the deep pelvis. Again noted are numerous hepatic and bilateral renal cysts. Some of the renal cysts appear to have hyperdense material as on prior exam which could represent hemorrhage or proteinaceous material. Aortic Atherosclerosis (ICD10-I70.0). Electronically Signed   By: Jonna Clark M.D.   On: 01/07/2020 03:36   DG Chest 2 View  Result Date: 01/06/2020 CLINICAL DATA:  Abdominal pain. EXAM: CHEST - 2 VIEW COMPARISON:  September 23, 2018 FINDINGS: The heart size and mediastinal contours are within normal limits. Both lungs are clear. Radiopaque surgical clips are seen overlying the right upper quadrant. The visualized skeletal structures are unremarkable. IMPRESSION: No active cardiopulmonary disease. Electronically Signed   By: Aram Candela M.D.   On: 01/06/2020 22:22    ____________________________________________   PROCEDURES  Procedure(s)  performed:yes .1-3 Lead EKG Interpretation Performed by: Nita Sickle, MD Authorized by: Nita Sickle, MD     Interpretation: non-specific     ECG rate assessment: normal     Rhythm: sinus rhythm     Ectopy: none     Critical Care performed: yes  CRITICAL CARE Performed by: Nita Sickle  ?  Total critical care time: 40 min  Critical care time was exclusive of separately billable procedures and treating other patients.  Critical care was necessary to treat or prevent imminent or life-threatening deterioration.  Critical care was time spent personally by me on the following activities: development of treatment plan with patient and/or surrogate as well as nursing, discussions with consultants, evaluation of patient's response to treatment, examination of patient, obtaining history from patient or surrogate, ordering and performing treatments and interventions, ordering and review of laboratory studies, ordering and review of radiographic studies, pulse oximetry and re-evaluation of patient's condition.  ____________________________________________   INITIAL IMPRESSION / ASSESSMENT AND PLAN / ED COURSE  64 y.o. female with a history of anemia, polycystic kidney disease, chronic kidney disease, hyperlipidemia, constipation who presents for evaluation of abdominal pain, bloating, nausea and vomiting x 1 for the last 12 hours.  Patient is afebrile with normal vital signs, abdomen is mildly distended with mild diffuse tenderness, no rebound or guarding, positive bowel sounds.  Differential diagnosis including SBO versus constipation versus diverticulitis versus volvulus versus appendicitis versus kidney stone versus UTI/ pyelo.  Labs showing no leukocytosis, mild stable anemia, no thrombocytosis, normal LFTs, no electrolyte derangements, stable kidney function, normal lipase.  UA with no signs of UTI.  Treat pain with IV fentanyl and Zofran, will get a CT of her  abdomen.  History gathered from patient and her husband was at bedside, plan discussed with both of them.  Old medical records reviewed.  Patient placed on telemetry for close monitoring.  _________________________ 4:28 AM on 01/07/2020 -----------------------------------------  CT visualized by me showing hemoperitoneum possibly from the splenic hematoma versus a ruptured cyst.  Discussed with Dr. Tonna Boehringer from surgery who recommended admission to the hospitalist service for close monitoring, trending of H&H. No surgical intervention at this time. Patient remains HD stable. Will send repeat H&H now and type and screen. Patient not anticoagulated.     _____________________________________________ Please note:  Patient was evaluated in Emergency Department today for the symptoms described in the history of present illness. Patient was evaluated in the context of the global COVID-19 pandemic, which necessitated consideration that the patient might be at risk for infection with the SARS-CoV-2 virus that causes COVID-19. Institutional protocols and algorithms that pertain to the evaluation of patients at risk for COVID-19 are in a state of rapid change based on information released by regulatory bodies including the CDC and federal and state organizations. These policies and algorithms were followed during the patient's care in the ED.  Some ED evaluations and interventions may be delayed as a result of limited staffing during the pandemic.   [place] Controlled Substance Database was reviewed by me. ____________________________________________   FINAL CLINICAL IMPRESSION(S) / ED DIAGNOSES   Final diagnoses:  Hemoperitoneum      NEW MEDICATIONS STARTED DURING THIS VISIT:  ED Discharge Orders    None       Note:  This document was prepared using Dragon voice recognition software and may include unintentional dictation errors.    Don Perking, [place], MD 01/07/20 [phone]

## 2020-01-07 NOTE — ED Notes (Signed)
Brought from main ed awaiting floor bed.  She is coughing often and say sshe has had bronchitis for 4 weeks.  Says s he is here for ruptured cyst in kidney.  She said she felt chilled, gave blanket.  Afebrile.

## 2020-01-07 NOTE — ED Notes (Signed)
Pt sitting upright in stretcher. Pt states "her nausea has subsided and she is starting to feel better".

## 2020-01-07 NOTE — H&P (Signed)
[place]   PATIENT NAME: Jamie Foley    MR#:  [id number]  DATE OF BIRTH:  18-Jul-1955  DATE OF ADMISSION:  01/07/2020  PRIMARY CARE PHYSICIAN: Jerl Mina, MD   REQUESTING/REFERRING PHYSICIAN: Nita Sickle, MD CHIEF COMPLAINT:   Chief Complaint  Patient presents with  . Abdominal Pain    HISTORY OF PRESENT ILLNESS:  Hadessah Grennan  is a 64 y.o. Caucasian female with a known history of polycystic kidney disease, anemia, osteoarthritis and chronic kidney disease, who presented to the emergency room with acute onset of abdominal pain that started at 4 PM which started in the right mid abdominal area with later radiation to her left upper and left lower quadrant.  She admitted to nausea and vomiting as well as tactile fever and chills with her pain.  She has been having cough for the last 4 weeks.  She took a course of amoxicillin for without significant improvement.  She was also given albuterol MDI.  No chest pain or palpitations.  No dyspnea orthopnea or paroxysmal nocturnal dyspnea.  She has been having mild pressure with urination without significant dysuria, oliguria or hematuria or flank pain.  Upon presentation to the emergency room, blood pressure was 157/113 with a pulse of 101 with otherwise normal vital signs.  Labs revealed creatinine of 1.86 compared to 1.81 on 10/23/2019.  CBC showed anemia with hemoglobin of 10 hematocrit 31.2 compared to 11.4 and 34.  Repeat H&H were 9.2 and 28.3.  INR is 1.1 and PT 13.6.  UA came back negative.  The patient was typed and crossmatched.  Chest x-ray showed no acute cardiopulmonary disease.  Abdominal pelvic CT scan revealed heterogeneously hyperdense area within the posterior spleen concerning for perisplenic hematoma and small amount of hemoperitoneum in the deep pelvis again noted are numerous hepatic and biliary renal cysts some of the renal cysts appeared to have hyperdense material as on prior exam which could represent  hemorrhage or proteinaceous material.  It also showed aortic atherosclerosis.  The patient was given 50 mcg of IV fentanyl and 4 mg of IV Zofran twice.  He will be admitted to a medical monitored bed for further evaluation and management. PAST MEDICAL HISTORY:   Past Medical History:  Diagnosis Date  . Anemia   . Arthritis   . Complication of anesthesia    HARD TO WAKE UP AFTER ANKLE SURGERY-FELT LIKE SHE RECEIVED TO MUCH ANESTHESIA  . Osteoporosis   . Polycystic kidney disease   . Renal insufficiency     PAST SURGICAL HISTORY:   Past Surgical History:  Procedure Laterality Date  . ABDOMINAL HYSTERECTOMY    . CATARACT EXTRACTION W/PHACO Left 10/14/2014   Procedure: CATARACT EXTRACTION PHACO AND INTRAOCULAR LENS PLACEMENT (IOC);  Surgeon: Sallee Lange, MD;  Location: [facility] ORS;  Service: Ophthalmology;  Laterality: Left;  [place] 01:09 AP% 60.0 CDE 37.21 fluid pack lot #[id number]  . CHOLECYSTECTOMY    . ESOPHAGOGASTRODUODENOSCOPY (EGD) WITH PROPOFOL N/A 12/01/2018   Procedure: ESOPHAGOGASTRODUODENOSCOPY (EGD) WITH PROPOFOL;  Surgeon: Toney Reil, MD;  Location: [facility] ENDOSCOPY;  Service: Gastroenterology;  Laterality: N/A;  . EYE SURGERY Left    DETACHED RETINA   . HERNIA REPAIR    . INGUINAL HERNIA REPAIR Left 08/03/2016   Procedure: HERNIA REPAIR INGUINAL ADULT;  Surgeon: Nadeen Landau, MD;  Location: [facility] ORS;  Service: General;  Laterality: Left;  . right ankle fracture Right   . VENTRAL HERNIA REPAIR N/A 08/03/2016   Procedure: HERNIA  REPAIR VENTRAL ADULT;  Surgeon: Nadeen Landau, MD;  Location: [facility] ORS;  Service: General;  Laterality: N/A;    SOCIAL HISTORY:   Social History   Tobacco Use  . Smoking status: Never Smoker  . Smokeless tobacco: Never Used  Substance Use Topics  . Alcohol use: No    FAMILY HISTORY:   Family History  Problem Relation Age of Onset  . Heart attack Father 42    DRUG ALLERGIES:   Allergies  Allergen Reactions  .  Augmentin [Amoxicillin-Pot Clavulanate] Nausea Only and Other (See Comments)    Stomach pain   . Levofloxacin Other (See Comments)  . Prednisone Nausea And Vomiting and Other (See Comments)    Doubled over with stomach pain and couldn't sleep    REVIEW OF SYSTEMS:   ROS As per history of present illness. All pertinent systems were reviewed above. Constitutional, HEENT, cardiovascular, respiratory, GI, GU, musculoskeletal, neuro, psychiatric, endocrine, integumentary and hematologic systems were reviewed and are otherwise negative/unremarkable except for positive findings mentioned above in the HPI.   MEDICATIONS AT HOME:   Prior to Admission medications   Medication Sig Start Date End Date Taking? Authorizing Provider  ascorbic acid (VITAMIN C) 500 MG tablet Take 250 mg by mouth daily.   Yes [provider]  calcium carbonate (TUMS - DOSED IN MG ELEMENTAL CALCIUM) 500 MG chewable tablet Chew 1 tablet by mouth 2 (two) times daily as needed for indigestion or heartburn.   Yes [provider]  chlorpheniramine-HYDROcodone (TUSSIONEX) 10-8 MG/5ML SUER Take 5 mLs by mouth every 12 (twelve) hours as needed for cough. 12/17/19  Yes [provider]  cyanocobalamin 1000 MCG tablet Take by mouth.   Yes [provider]  fluticasone (FLONASE) 50 MCG/ACT nasal spray Place 2 sprays into both nostrils daily. 12/17/19  Yes [provider]  senna-docusate (SENOKOT-S) 8.6-50 MG tablet Take by mouth.  09/27/18  Yes [provider]      VITAL SIGNS:  Blood pressure (!) 171/99, pulse 98, temperature 98.1 F (36.7 C), temperature source Oral, resp. rate 18, height 5\' 5"  (1.651 m), weight 55 kg, SpO2 97 %.  PHYSICAL EXAMINATION:  Physical Exam  GENERAL:  63 y.o.-year-old Caucasian female patient lying in the bed with no acute distress.  EYES: Pupils equal, round, reactive to light and accommodation. No scleral icterus. Extraocular muscles intact.  HEENT:  Head atraumatic, normocephalic. Oropharynx and nasopharynx clear.  NECK:  Supple, no jugular venous distention. No thyroid enlargement, no tenderness.  LUNGS: Normal breath sounds bilaterally, no wheezing, rales,rhonchi or crepitation. No use of accessory muscles of respiration.  CARDIOVASCULAR: Regular rate and rhythm, S1, S2 normal. No murmurs, rubs, or gallops.  ABDOMEN: Soft, nondistended, with diffuse abdominal tenderness without rebound tenderness guarding or rigidity.  Bowel sounds present. No organomegaly or mass.  EXTREMITIES: No pedal edema, cyanosis, or clubbing.  NEUROLOGIC: Cranial nerves II through XII are intact. Muscle strength 5/5 in all extremities. Sensation intact. Gait not checked.  PSYCHIATRIC: The patient is alert and oriented x 3.  Normal affect and good eye contact. SKIN: No obvious rash, lesion, or ulcer.   LABORATORY PANEL:   CBC Recent Labs  Lab 01/06/20 1757 01/06/20 1757 01/07/20 0423  WBC 8.8  --   --   HGB 10.0*   < > 9.2*  HCT 31.2*   < > 28.3*  PLT 174  --   --    < > = values in this interval not displayed.   ------------------------------------------------------------------------------------------------------------------  Chemistries  Recent Labs  Lab 01/06/20 1757  NA 138  K 4.1  CL 102  CO2 24  GLUCOSE 129*  BUN 30*  CREATININE 1.86*  CALCIUM 9.3  AST 16  ALT 11  ALKPHOS 38  BILITOT 0.9   ------------------------------------------------------------------------------------------------------------------  Cardiac Enzymes No results for input(s): TROPONINI in the last 168 hours. ------------------------------------------------------------------------------------------------------------------  RADIOLOGY:  CT ABDOMEN PELVIS WO CONTRAST  Result Date: 01/07/2020 CLINICAL DATA:  Abdominal distension EXAM: CT ABDOMEN AND PELVIS WITHOUT CONTRAST TECHNIQUE: Multidetector CT imaging of the abdomen and pelvis was performed following the  standard protocol without IV contrast. COMPARISON:  September 20, 2018 FINDINGS: Lower chest: The visualized heart size within normal limits. No pericardial fluid/thickening. No hiatal hernia. The visualized portions of the lungs are clear. Hepatobiliary: Again noted are multiple low-density is seen throughout the liver parenchyma the largest in the anterior left liver lobe measuring 4.7 cm. The patient is status post cholecystectomy. No evidence of calcified gallstones or biliary ductal dilatation. Pancreas:  Unremarkable.  No surrounding inflammatory changes. Spleen: There is a heterogeneously hyperdense area seen within the posterior spleen which could represent a perisplenic hematoma. Adrenals/Urinary Tract: Both adrenal glands appear normal. Again noted are multiple low-density the cysts seen throughout both kidneys. Several which contain calcifications. There is also hyperdense material seen within several of the cysts seen within the lower pole of the right kidney which could represent proteinaceous/hemorrhage. Again noted is enlargement of the right kidney. The bladder is unremarkable. Stomach/Bowel: The stomach, small bowel, and colon are normal in appearance. No inflammatory changes or obstructive findings. appendix is normal. Vascular/Lymphatic: There are no enlarged abdominal or pelvic lymph nodes. Scattered aortic atherosclerotic calcifications are seen without aneurysmal dilatation. Reproductive: The patient is status post hysterectomy. No adnexal masses or collections seen. Other: Hemoperitoneum is seen within the deep pelvis. Musculoskeletal: No acute or significant osseous findings. IMPRESSION: Heterogeneously hyperdense area within the posterior spleen, concerning for perisplenic hematoma. Small amount of hemoperitoneum in the deep pelvis. Again noted are numerous hepatic and bilateral renal cysts. Some of the renal cysts appear to have hyperdense material as on prior exam which could represent  hemorrhage or proteinaceous material. Aortic Atherosclerosis (ICD10-I70.0). Electronically Signed   By: Jonna Clark M.D.   On: 01/07/2020 03:36   DG Chest 2 View  Result Date: 01/06/2020 CLINICAL DATA:  Abdominal pain. EXAM: CHEST - 2 VIEW COMPARISON:  September 23, 2018 FINDINGS: The heart size and mediastinal contours are within normal limits. Both lungs are clear. Radiopaque surgical clips are seen overlying the right upper quadrant. The visualized skeletal structures are unremarkable. IMPRESSION: No active cardiopulmonary disease. Electronically Signed   By: Aram Candela M.D.   On: 01/06/2020 22:22      IMPRESSION AND PLAN:   1.  Acute abdominal pain with perisplenic hematoma and small deep pelvic hemoperitoneum in the setting of polycystic kidney disease with possibly bleeding ruptured cyst. -The patient will be admitted to a medical monitored bed. -She was typed and crossmatched and will follow serial hemoglobins and hematocrits. -General surgery consult will be obtained. -Dr. Tonna Boehringer was contacted about the patient and will evaluate the patient. -Pain management will be provided.  2.  Acute on chronic anemia. -We will follow serial hemoglobins and hematocrits as mentioned above. -At this time believe the patient needs transfusion yet.  3.  Acute kidney injury superimposed on stage IIIb chronic kidney disease with history of polycystic kidney disease. -The patient will be hydrated with IV normal saline and  will follow BMPs.  4.  Acute bronchitis. -We will place her on IV Rocephin -We will give mucolytic therapy as well as as needed DuoNeb's.  5.  DVT prophylaxis. - SCDs. -Medical prophylaxis is currently contraindicated due to possible underlying bleeding.   All the records are reviewed and case discussed with ED provider. The plan of care was discussed in details with the patient (and family). I answered all questions. The patient agreed to proceed with the above mentioned  plan. Further management will depend upon hospital course.   CODE STATUS: Full code  Status is: Inpatient  The patient remains inpatient appropriate and will need to stay more than 2 midnights.  Dispo: The patient is from: Home              Anticipated d/c is to: Home              Anticipated d/c date is: 3 days              Patient currently is not medically stable to d/c.   TOTAL TIME TAKING CARE OF THIS PATIENT: 55 minutes.    Hannah BeatJan A Ziyah Cordoba M.D on 01/07/2020 at 5:44 AM  [facility]   From 7 PM-7 AM, contact night-coverage [email or web address]  CC: Primary care physician; Jerl MinaHedrick, James, MD

## 2020-01-08 LAB — CBC
HCT: 24.7 % — ABNORMAL LOW (ref 36.0–46.0)
Hemoglobin: 8.4 g/dL — ABNORMAL LOW (ref 12.0–15.0)
MCH: 28.2 pg (ref 26.0–34.0)
MCHC: 34 g/dL (ref 30.0–36.0)
MCV: 82.9 fL (ref 80.0–100.0)
Platelets: 125 10*3/uL — ABNORMAL LOW (ref 150–400)
RBC: 2.98 MIL/uL — ABNORMAL LOW (ref 3.87–5.11)
RDW: 12.1 % (ref 11.5–15.5)
WBC: 6.3 10*3/uL (ref 4.0–10.5)
nRBC: 0 % (ref 0.0–0.2)

## 2020-01-08 LAB — BASIC METABOLIC PANEL
Anion gap: 8 (ref 5–15)
BUN: 24 mg/dL — ABNORMAL HIGH (ref 8–23)
CO2: 25 mmol/L (ref 22–32)
Calcium: 8.6 mg/dL — ABNORMAL LOW (ref 8.9–10.3)
Chloride: 104 mmol/L (ref 98–111)
Creatinine, Ser: 1.68 mg/dL — ABNORMAL HIGH (ref 0.44–1.00)
GFR calc Af Amer: 37 mL/min — ABNORMAL LOW (ref >60)
GFR calc non Af Amer: 32 mL/min — ABNORMAL LOW (ref >60)
Glucose, Bld: 121 mg/dL — ABNORMAL HIGH (ref 70–99)
Potassium: 3.7 mmol/L (ref 3.5–5.1)
Sodium: 137 mmol/L (ref 135–145)

## 2020-01-08 LAB — PROCALCITONIN: Procalcitonin: 0.18 ng/mL

## 2020-01-08 LAB — HIV ANTIBODY (ROUTINE TESTING W REFLEX): HIV Screen 4th Generation wRfx: NONREACTIVE

## 2020-01-08 MED ORDER — TRAMADOL HCL 50 MG PO TABS: 50.0000 mg | ORAL_TABLET | Freq: Two times a day (BID) | ORAL | 0 refills | Status: DC | PRN

## 2020-01-08 NOTE — Discharge Summary (Signed)
Physician Discharge Summary  Jamie RamusRebecca D Tuggle NFA:[id number] DOB: [birth year] DOA: 01/07/2020  PCP: Jerl MinaHedrick, James, MD  Admit date: 01/07/2020 Discharge date: 01/08/2020  Admitted From: Home Disposition: Home  Recommendations for Outpatient Follow-up:  1. Follow up with PCP in 1-2 weeks 2. Follow-up with nephrology, Dr. Imagene Gurneyhristina Wyatt at [facility] in 1 week 3. Per [facility] general surgery, if symptoms recur, patient would benefit from care at a tertiary center for a tight disciplinary approach given the complexity of her case 4. Please obtain BMP/CBC in one week  Home Health: No Equipment/Devices: None  Discharge Condition: Stable CODE STATUS: Full code Diet recommendation: Regular diet  History of present illness:  Jamie Foley is a 64 year old Caucasian female with past medical history notable for polycystic kidney disease, anemia of chronic disease, osteoarthritis, CKD stage IIIb who [facility] emergency department with acute onset abdominal pain.  Patient reported onset of symptoms 4 PM localized to the right mid abdomen with radiation towards her left upper/lower quadrant.  Also with associated nausea/vomiting and subjective fevers and chills.  Further, patient reports cough over the past 4 weeks in which she completed a course of amoxicillin without significant improvement.  In the ED, BP 157/113, HR 101, creatinine 1.86, hemoglobin 10.0, INR 1.1.  Urinalysis unrevealing.  Covid-19 PCR negative.  Chest x-ray with no acute cardiopulmonary disease process.  CT abdomen/pelvis notable for heterogeneously hypodense area within the posterior spleen concerning for perisplenic hematoma and small amount of hemoperitoneum deep pelvis with numerous hepatic and renal cysts with some of the renal cyst with hyperdense material that could be consistent with hemorrhage/proteinaceous material.  General surgery was consulted.  Hospitalist service was consulted for admission  in regards to further evaluation and treatment.  Hospital course:  Acute abdominal pain secondary to hemoperitoneum Patient presenting to the ED with acute onset abdominal pain with radiation to right upper/lower quadrant.  Patient is afebrile without leukocytosis, unlikely infectious etiology.  CT abdomen/pelvis concerning for perisplenic hematoma and hemoperitoneum.  Patient with underlying polycystic kidney disease with concern for possible hemorrhage within her cysts.  Patient's hemoglobin was monitored every 8 hours and remained stable during hospitalization.  Repeat CT abdomen/pelvis showed stable perisplenic hematoma and stable versus slightly smaller free pelvic hematoma.  [facility] was [facility], Dr. Tonna BoehringerSakai who did not recommend any surgical intervention or further evaluation at this time.  Dr. Tonna BoehringerSakai recommends if symptoms recur, patient be evaluated at a tertiary center such as [facility] for a multidisciplinary approach given the complexity of her case.  Patient will need close follow-up with her primary nephrologist, Dr. Imagene Gurneyhristina Wyatt within 1 week, has not been seen for over 1 year now.  Recommend repeat CBC at next visit.  Acute renal failure on CKD stage IIIb: Resolved Baseline creatinine 1.6-1.7.  With a GFR 30-32.  On admission, patient's creatinine 1.86 with a GFR of 28.  Follows with [facility], Dr. Imagene Gurneyhristina Wyatt.  Reviewed previous clinic note, last dated 11/13/2018.  Given her PCKD, eventually will likely lead to ESRD requiring RRT.  Patient was given IV fluid hydration with improvement of her creatinine to 1.68 at time of discharge.  Recommend close follow-up with her primary nephrologist as above.  Repeat BMP 1 week.  Anemia Iron panel June 2020 with iron 7, TIBC 173 (low), ferritin 255, B12 05/09/2003, folate 46.0.;  Likely multifactorial in the setting of iron deficiency from her hemorrhagic cysts as well as prior noted B12  deficiency.  Repeat CBC 1 week.  Polycystic kidney disease Follows with [facility] nephrology, Dr. Imagene Gurney.  Last visit noted in EMR 11/13/2018.  Needs close outpatient follow-up.  Cough Was recently prescribed amoxicillin without any appreciable change.  Covid-19 PCR negative.  Chest x-ray with no acute cardiopulmonary disease process.   Discharge Diagnoses:  Principal Problem:   Hemoperitoneum Active Problems:   PKD (polycystic kidney disease)   Anemia   Abdominal pain   Perisplenic hematoma    Discharge Instructions  Discharge Instructions    Call MD for:  difficulty breathing, headache or visual disturbances   Complete by: As directed    Call MD for:  extreme fatigue   Complete by: As directed    Call MD for:  persistant dizziness or light-headedness   Complete by: As directed    Call MD for:  persistant nausea and vomiting   Complete by: As directed    Call MD for:  severe uncontrolled pain   Complete by: As directed    Call MD for:  temperature >100.4   Complete by: As directed    Diet - low sodium heart healthy   Complete by: As directed    Increase activity slowly   Complete by: As directed      Allergies as of 01/08/2020      Reactions   Augmentin [amoxicillin-pot Clavulanate] Nausea Only, Other (See Comments)   Stomach pain    Levofloxacin Other (See Comments)   Prednisone Nausea And Vomiting, Other (See Comments)   Doubled over with stomach pain and couldn't sleep      Medication List    TAKE these medications   ascorbic acid 500 MG tablet Commonly known as: VITAMIN C Take 250 mg by mouth daily.   calcium carbonate 500 MG chewable tablet Commonly known as: TUMS - dosed in mg elemental calcium Chew 1 tablet by mouth 2 (two) times daily as needed for indigestion or heartburn.   chlorpheniramine-HYDROcodone 10-8 MG/5ML Suer Commonly known as: TUSSIONEX Take 5 mLs by mouth every 12 (twelve) hours as needed for cough.    cyanocobalamin 1000 MCG tablet Take by mouth.   fluticasone 50 MCG/ACT nasal spray Commonly known as: FLONASE Place 2 sprays into both nostrils daily.   senna-docusate 8.6-50 MG tablet Commonly known as: Senokot-S Take by mouth.   traMADol 50 MG tablet Commonly known as: Ultram Take 1 tablet (50 mg total) by mouth every 12 (twelve) hours as needed.       Follow-up Information    Jerl Mina, MD. Schedule an appointment as soon as possible for a visit in 1 week(s).   Specialty: Family Medicine Contact information: [address] [place] [address] [phone]        Dallie Piles, MD. Schedule an appointment as soon as possible for a visit in 1 week(s).   Specialty: Nephrology Contact information: [address] [place] [address] [phone]              Allergies  Allergen Reactions  . Augmentin [Amoxicillin-Pot Clavulanate] Nausea Only and Other (See Comments)    Stomach pain   . Levofloxacin Other (See Comments)  . Prednisone Nausea And Vomiting and Other (See Comments)    Doubled over with stomach pain and couldn't sleep    Consultations:  General surgery, Dr. Tonna Boehringer   Procedures/Studies: CT ABDOMEN PELVIS WO CONTRAST  Result Date: 01/07/2020 CLINICAL DATA:  Left upper quadrant abdominal pain. Re-evaluate splenic hematoma. EXAM: CT  ABDOMEN AND PELVIS WITHOUT CONTRAST TECHNIQUE: Multidetector CT imaging of the abdomen and pelvis was performed following the standard protocol without IV contrast. COMPARISON:  Earlier CT scan, same date. FINDINGS: Lower chest: The lung bases are clear of an acute process. The heart is normal in size. Stable tortuosity and ectasia of the thoracic aorta. Hepatobiliary: Numerous hepatic cysts some which are complicated by septations and calcifications. No worrisome hepatic lesions are identified without contrast. No perihepatic hematoma. Pancreas: No mass, inflammation or ductal dilatation. Spleen: Stable  appearing perisplenic hematoma. Maximum diameter is stable at just under 2 cm. No obvious splenic laceration or mass. Adrenals/Urinary Tract: Stable polycystic kidney disease with markedly enlarged polycystic kidneys. There are also scattered calcifications and areas of probable hemorrhage. The bladder is grossly normal. Stomach/Bowel: The stomach, duodenum, small bowel and colon are grossly normal without oral contrast. No inflammatory changes, mass lesions or obstructive findings. Vascular/Lymphatic: Stable vascular calcifications. No aneurysm. Scattered mesenteric and retroperitoneal lymph nodes but no mass or overt adenopathy. Reproductive: Surgically absent. Other: Stable to slightly smaller free pelvic hematoma. No progressive findings. Musculoskeletal: No significant bony findings. No left-sided rib fractures are identified. IMPRESSION: 1. Stable appearing perisplenic hematoma. No obvious splenic laceration or lesion. 2. Stable to slightly smaller free pelvic hematoma. 3. Stable polycystic kidney disease with markedly enlarged polycystic kidneys and numerous hepatic cysts. 4. No abdominal/pelvic mass lesions or adenopathy. Electronically Signed   By: Rudie Meyer M.D.   On: 01/07/2020 16:32   CT ABDOMEN PELVIS WO CONTRAST  Result Date: 01/07/2020 CLINICAL DATA:  Abdominal distension EXAM: CT ABDOMEN AND PELVIS WITHOUT CONTRAST TECHNIQUE: Multidetector CT imaging of the abdomen and pelvis was performed following the standard protocol without IV contrast. COMPARISON:  September 20, 2018 FINDINGS: Lower chest: The visualized heart size within normal limits. No pericardial fluid/thickening. No hiatal hernia. The visualized portions of the lungs are clear. Hepatobiliary: Again noted are multiple low-density is seen throughout the liver parenchyma the largest in the anterior left liver lobe measuring 4.7 cm. The patient is status post cholecystectomy. No evidence of calcified gallstones or biliary ductal  dilatation. Pancreas:  Unremarkable.  No surrounding inflammatory changes. Spleen: There is a heterogeneously hyperdense area seen within the posterior spleen which could represent a perisplenic hematoma. Adrenals/Urinary Tract: Both adrenal glands appear normal. Again noted are multiple low-density the cysts seen throughout both kidneys. Several which contain calcifications. There is also hyperdense material seen within several of the cysts seen within the lower pole of the right kidney which could represent proteinaceous/hemorrhage. Again noted is enlargement of the right kidney. The bladder is unremarkable. Stomach/Bowel: The stomach, small bowel, and colon are normal in appearance. No inflammatory changes or obstructive findings. appendix is normal. Vascular/Lymphatic: There are no enlarged abdominal or pelvic lymph nodes. Scattered aortic atherosclerotic calcifications are seen without aneurysmal dilatation. Reproductive: The patient is status post hysterectomy. No adnexal masses or collections seen. Other: Hemoperitoneum is seen within the deep pelvis. Musculoskeletal: No acute or significant osseous findings. IMPRESSION: Heterogeneously hyperdense area within the posterior spleen, concerning for perisplenic hematoma. Small amount of hemoperitoneum in the deep pelvis. Again noted are numerous hepatic and bilateral renal cysts. Some of the renal cysts appear to have hyperdense material as on prior exam which could represent hemorrhage or proteinaceous material. Aortic Atherosclerosis (ICD10-I70.0). Electronically Signed   By: Jonna Clark M.D.   On: 01/07/2020 03:36   DG Chest 2 View  Result Date: 01/06/2020 CLINICAL DATA:  Abdominal pain. EXAM: CHEST - 2 VIEW  COMPARISON:  September 23, 2018 FINDINGS: The heart size and mediastinal contours are within normal limits. Both lungs are clear. Radiopaque surgical clips are seen overlying the right upper quadrant. The visualized skeletal structures are unremarkable.  IMPRESSION: No active cardiopulmonary disease. Electronically Signed   By: Aram Candela M.D.   On: 01/06/2020 22:22      Subjective: Patient seen and examined bedside, resting comfortably.  Discussed stable hemoglobin and interval follow-up of CT scan which was stable.  Dr. Tonna Boehringer recommended discharge with close outpatient follow-up with her nephrologist.  Discussed with patient if recurrence, may need evaluation at a tertiary center such as [facility] for a multidisciplinary approach given her complexity of her case.  No other questions or concerns at this time.  Denies headache, no dizziness, no chest pain, palpitations, no shortness of breath, no fever/chills/night sweats, no nausea/vomiting/diarrhea, no weakness, no fatigue, no paresthesias.  No acute events overnight per nursing staff.  Discharge Exam: Vitals:   01/08/20 0120 01/08/20 0446  BP:  (!) 159/88  Pulse: 87 81  Resp:  18  Temp:  98.7 F (37.1 C)  SpO2: 100% 94%   Vitals:   01/08/20 0002 01/08/20 0119 01/08/20 0120 01/08/20 0446  BP: (!) 157/94 (!) 160/89  (!) 159/88  Pulse: 81 84 87 81  Resp: Temp: 98.9 F (37.2 C) 98.6 F (37 C)  98.7 F (37.1 C)  TempSrc: Oral Oral  Oral  SpO2: 98% 98% 100% 94%  Weight:      Height:        General: Pt is alert, awake, not in acute distress Cardiovascular: RRR, S1/S2 +, no rubs, no gallops Respiratory: CTA bilaterally, no wheezing, no rhonchi Abdominal: Soft, mild TTP left upper/lower quadrant, ND, bowel sounds + Extremities: no edema, no cyanosis    The results of significant diagnostics from this hospitalization (including imaging, microbiology, ancillary and laboratory) are listed below for reference.     Microbiology: Recent Results (from the past 240 hour(s))  Respiratory Panel by RT PCR (Flu A&B, Covid) - Nasopharyngeal Swab     Status: None   Collection Time: 01/07/20  4:36 AM   Specimen: Nasopharyngeal Swab  Result Value Ref  Range Status   SARS Coronavirus 2 by RT PCR NEGATIVE NEGATIVE Final    Comment: (NOTE) SARS-CoV-2 target nucleic acids are NOT DETECTED.  The SARS-CoV-2 RNA is generally detectable in upper respiratoy specimens during the acute phase of infection. The lowest concentration of SARS-CoV-2 viral copies this assay can detect is 131 copies/mL. A negative result does not preclude SARS-Cov-2 infection and should not be used as the sole basis for treatment or other patient management decisions. A negative result may occur with  improper specimen collection/handling, submission of specimen other than nasopharyngeal swab, presence of viral mutation(s) within the areas targeted by this assay, and inadequate number of viral copies (<131 copies/mL). A negative result must be combined with clinical observations, patient history, and epidemiological information. The expected result is Negative.  Fact Sheet for Patients:  https://www.moore.com/  Fact Sheet for Healthcare Providers:  https://www.young.biz/  This test is no t yet approved or cleared by the [place] FDA and  has been authorized for detection and/or diagnosis of SARS-CoV-2 by FDA under an Emergency Use Authorization (EUA). This EUA will remain  in effect (meaning this test can be used) for the duration of the COVID-19 declaration under Section 564(b)(1) of the Act, 21 U.S.C. section 360bbb-3(b)(1), unless the  authorization is terminated or revoked sooner.     Influenza A by PCR NEGATIVE NEGATIVE Final   Influenza B by PCR NEGATIVE NEGATIVE Final    Comment: (NOTE) The Xpert Xpress SARS-CoV-2/FLU/RSV assay is intended as an aid in  the diagnosis of influenza from Nasopharyngeal swab specimens and  should not be used as a sole basis for treatment. Nasal washings and  aspirates are unacceptable for Xpert Xpress SARS-CoV-2/FLU/RSV  testing.  Fact Sheet for  Patients: https://www.moore.com/  Fact Sheet for Healthcare Providers: https://www.young.biz/  This test is not yet approved or cleared by the [place] FDA and  has been authorized for detection and/or diagnosis of SARS-CoV-2 by  FDA under an Emergency Use Authorization (EUA). This EUA will remain  in effect (meaning this test can be used) for the duration of the  Covid-19 declaration under Section 564(b)(1) of the Act, 21  U.S.C. section 360bbb-3(b)(1), unless the authorization is  terminated or revoked. Performed at [facility], [address] [place], [place] [address]      Labs: BNP (last 3 results) No results for input(s): BNP in the last 8760 hours. Basic Metabolic Panel: Recent Labs  Lab 01/06/20 1757 01/08/20 0413  NA 138 137  K 4.1 3.7  CL 102 104  CO2 24 25  GLUCOSE 129* 121*  BUN 30* 24*  CREATININE 1.86* 1.68*  CALCIUM 9.3 8.6*   Liver Function Tests: Recent Labs  Lab 01/06/20 1757  AST 16  ALT 11  ALKPHOS 38  BILITOT 0.9  PROT 6.9  ALBUMIN 3.8   Recent Labs  Lab 01/06/20 1757  LIPASE 31   No results for input(s): AMMONIA in the last 168 hours. CBC: Recent Labs  Lab 01/06/20 1757 01/07/20 0423 01/07/20 1000 01/07/20 1938 01/08/20 0413  WBC 8.8  --   --   --  6.3  HGB 10.0* 9.2* 8.8* 9.2* 8.4*  HCT 31.2* 28.3* 25.9* 27.1* 24.7*  MCV 86.7  --   --   --  82.9  PLT 174  --   --   --  125*   Cardiac Enzymes: No results for input(s): CKTOTAL, CKMB, CKMBINDEX, TROPONINI in the last 168 hours. BNP: Invalid input(s): POCBNP CBG: No results for input(s): GLUCAP in the last 168 hours. D-Dimer No results for input(s): DDIMER in the last 72 hours. Hgb A1c No results for input(s): HGBA1C in the last 72 hours. Lipid Profile No results for input(s): CHOL, HDL, LDLCALC, TRIG, CHOLHDL, LDLDIRECT in the last 72 hours. Thyroid function studies No results for input(s): TSH, T4TOTAL, T3FREE,  THYROIDAB in the last 72 hours.  Invalid input(s): FREET3 Anemia work up Recent Labs    01/06/20 1807 01/07/20 1458  VITAMINB12  --  1,142*  FOLATE 25.0  --   FERRITIN 63  --   TIBC 255  --   IRON 31  --   RETICCTPCT  --  1.4   Urinalysis    Component Value Date/Time   COLORURINE STRAW (A) 01/06/2020 1757   APPEARANCEUR CLEAR (A) 01/06/2020 1757   LABSPEC 1.010 01/06/2020 1757   PHURINE 5.0 01/06/2020 1757   GLUCOSEU NEGATIVE 01/06/2020 1757   HGBUR NEGATIVE 01/06/2020 1757   BILIRUBINUR NEGATIVE 01/06/2020 1757   KETONESUR NEGATIVE 01/06/2020 1757   PROTEINUR NEGATIVE 01/06/2020 1757   UROBILINOGEN 0.2 06/16/2010 1823   NITRITE NEGATIVE 01/06/2020 1757   LEUKOCYTESUR TRACE (A) 01/06/2020 1757   Sepsis Labs Invalid input(s): PROCALCITONIN,  WBC,  LACTICIDVEN Microbiology Recent Results (from the  past 240 hour(s))  Respiratory Panel by RT PCR (Flu A&B, Covid) - Nasopharyngeal Swab     Status: None   Collection Time: 01/07/20  4:36 AM   Specimen: Nasopharyngeal Swab  Result Value Ref Range Status   SARS Coronavirus 2 by RT PCR NEGATIVE NEGATIVE Final    Comment: (NOTE) SARS-CoV-2 target nucleic acids are NOT DETECTED.  The SARS-CoV-2 RNA is generally detectable in upper respiratoy specimens during the acute phase of infection. The lowest concentration of SARS-CoV-2 viral copies this assay can detect is 131 copies/mL. A negative result does not preclude SARS-Cov-2 infection and should not be used as the sole basis for treatment or other patient management decisions. A negative result may occur with  improper specimen collection/handling, submission of specimen other than nasopharyngeal swab, presence of viral mutation(s) within the areas targeted by this assay, and inadequate number of viral copies (<131 copies/mL). A negative result must be combined with clinical observations, patient history, and epidemiological information. The expected result is  Negative.  Fact Sheet for Patients:  https://www.moore.com/  Fact Sheet for Healthcare Providers:  https://www.young.biz/  This test is no t yet approved or cleared by the [place] FDA and  has been authorized for detection and/or diagnosis of SARS-CoV-2 by FDA under an Emergency Use Authorization (EUA). This EUA will remain  in effect (meaning this test can be used) for the duration of the COVID-19 declaration under Section 564(b)(1) of the Act, 21 U.S.C. section 360bbb-3(b)(1), unless the authorization is terminated or revoked sooner.     Influenza A by PCR NEGATIVE NEGATIVE Final   Influenza B by PCR NEGATIVE NEGATIVE Final    Comment: (NOTE) The Xpert Xpress SARS-CoV-2/FLU/RSV assay is intended as an aid in  the diagnosis of influenza from Nasopharyngeal swab specimens and  should not be used as a sole basis for treatment. Nasal washings and  aspirates are unacceptable for Xpert Xpress SARS-CoV-2/FLU/RSV  testing.  Fact Sheet for Patients: https://www.moore.com/  Fact Sheet for Healthcare Providers: https://www.young.biz/  This test is not yet approved or cleared by the [place] FDA and  has been authorized for detection and/or diagnosis of SARS-CoV-2 by  FDA under an Emergency Use Authorization (EUA). This EUA will remain  in effect (meaning this test can be used) for the duration of the  Covid-19 declaration under Section 564(b)(1) of the Act, 21  U.S.C. section 360bbb-3(b)(1), unless the authorization is  terminated or revoked. Performed at [facility], [address] [place], [place] [address]      Time coordinating discharge: Over 30 minutes  SIGNED:   Alvira Philips [place], DO  [facility] 01/08/2020, 10:00 AM

## 2020-01-08 NOTE — Progress Notes (Signed)
PT Cancellation Note  Patient Details Name: Jamie Foley MRN: [id number] DOB: 01/17/1956   Cancelled Treatment:    Reason Eval/Treat Not Completed:  (Consult received and chart reviewed. Per primary RN, patient has been mobilizing indep in room; no issues or safety concerns noted.  No PT needs identified; planning for discharge today. Will complete order; please re-consult should needs change.)   Kymani Laursen H. Manson Passey, PT, DPT, NCS 01/08/20, 11:26 AM [phone]

## 2020-01-08 NOTE — ED Notes (Signed)
Patient removed from monitor to get up to go to bathroom. Pt ambulated in hallway well, steady gait. Mild intermittent nausea and pain reported from pt, "not as bad as yesterday though".

## 2020-01-08 NOTE — ED Notes (Signed)
Blood bank called this RN to report 2 units of blood ready. No orders to transfuse RBCs noted in chart, orders, or signed and held orders. Will report to oncoming shift.

## 2020-01-09 LAB — TYPE AND SCREEN
ABO/RH(D): O POS
Antibody Screen: NEGATIVE
Unit division: 0
Unit division: 0

## 2020-01-09 LAB — BPAM RBC
Blood Product Expiration Date: 202110312359
Blood Product Expiration Date: 202110312359
Unit Type and Rh: 5100
Unit Type and Rh: 5100

## 2020-01-09 LAB — PREPARE RBC (CROSSMATCH)

## 2020-04-10 ENCOUNTER — Other Ambulatory Visit: Payer: Self-pay | Admitting: Family Medicine

## 2020-04-10 ENCOUNTER — Other Ambulatory Visit (HOSPITAL_COMMUNITY): Payer: Self-pay | Admitting: Family Medicine

## 2020-04-10 DIAGNOSIS — Q613 Polycystic kidney, unspecified: Secondary | ICD-10-CM

## 2020-05-21 ENCOUNTER — Other Ambulatory Visit: Payer: Self-pay

## 2020-05-21 ENCOUNTER — Ambulatory Visit (HOSPITAL_COMMUNITY): Payer: Medicare Other

## 2020-05-21 ENCOUNTER — Ambulatory Visit
Admission: RE | Admit: 2020-05-21 | Discharge: 2020-05-21 | Disposition: A | Discharge status: Home / Self Care | Payer: Medicare Other | Source: Ambulatory Visit | Attending: Family Medicine | Admitting: Family Medicine

## 2020-05-21 DIAGNOSIS — Q613 Polycystic kidney, unspecified: Secondary | ICD-10-CM | POA: Diagnosis not present

## 2020-06-11 ENCOUNTER — Ambulatory Visit (INDEPENDENT_AMBULATORY_CARE_PROVIDER_SITE_OTHER): Payer: Medicare Other | Admitting: Dermatology

## 2020-06-11 ENCOUNTER — Other Ambulatory Visit: Payer: Self-pay

## 2020-06-11 DIAGNOSIS — L820 Inflamed seborrheic keratosis: Secondary | ICD-10-CM

## 2020-06-11 NOTE — Progress Notes (Signed)
   New Patient Visit  Subjective  Jamie Foley is a 65 y.o. female who presents for the following: check spot (Back, long hx, itchy, irritated by bra).  The following portions of the chart were reviewed this encounter and updated as appropriate:   Tobacco  Allergies  Meds  Problems  Med Hx  Surg Hx  Fam Hx     Review of Systems:  No other skin or systemic complaints except as noted in HPI or Assessment and Plan.  Objective  Well appearing patient in no apparent distress; mood and affect are within normal limits.  A focused examination was performed including back. Relevant physical exam findings are noted in the Assessment and Plan.  Objective  L back x 1: Erythematous keratotic or waxy stuck-on papule or plaque.    Assessment & Plan    Seborrheic Keratoses - Stuck-on, waxy, tan-brown papules and plaques  - Discussed benign etiology and prognosis. - Observe - Call for any changes  Inflamed seborrheic keratosis L back x 1  Destruction of lesion - L back x 1 Complexity: simple   Destruction method: cryotherapy   Informed consent: discussed and consent obtained   Timeout:  patient name, date of birth, surgical site, and procedure verified Lesion destroyed using liquid nitrogen: Yes   Region frozen until ice ball extended beyond lesion: Yes   Outcome: patient tolerated procedure well with no complications   Post-procedure details: wound care instructions given    Return if symptoms worsen or fail to improve.  I, Ardis Rowan, RMA, am acting as scribe for Armida Sans, MD .  Documentation: I have reviewed the above documentation for accuracy and completeness, and I agree with the above.  Armida Sans, MD

## 2020-06-11 NOTE — Patient Instructions (Signed)

## 2020-06-12 ENCOUNTER — Encounter: Payer: Self-pay | Admitting: Dermatology

## 2020-09-18 ENCOUNTER — Other Ambulatory Visit: Payer: Self-pay | Admitting: Family Medicine

## 2020-09-18 DIAGNOSIS — Z1231 Encounter for screening mammogram for malignant neoplasm of breast: Secondary | ICD-10-CM

## 2020-09-30 ENCOUNTER — Ambulatory Visit
Admission: RE | Admit: 2020-09-30 | Discharge: 2020-09-30 | Disposition: A | Discharge status: Home / Self Care | Payer: Medicare Other | Source: Ambulatory Visit | Attending: Family Medicine | Admitting: Family Medicine

## 2020-09-30 ENCOUNTER — Other Ambulatory Visit: Payer: Self-pay

## 2020-09-30 DIAGNOSIS — Z1231 Encounter for screening mammogram for malignant neoplasm of breast: Secondary | ICD-10-CM

## 2020-10-02 ENCOUNTER — Encounter: Payer: Self-pay | Admitting: Ophthalmology

## 2020-10-15 ENCOUNTER — Other Ambulatory Visit: Payer: Self-pay

## 2020-10-15 ENCOUNTER — Ambulatory Visit: Payer: Medicare Other | Admitting: Anesthesiology

## 2020-10-15 ENCOUNTER — Encounter
Admission: RE | Disposition: A | Discharge status: Home / Self Care | Payer: Self-pay | Source: Ambulatory Visit | Attending: Ophthalmology

## 2020-10-15 ENCOUNTER — Ambulatory Visit
Admission: RE | Admit: 2020-10-15 | Discharge: 2020-10-15 | Disposition: A | Discharge status: Home / Self Care | Payer: Medicare Other | Source: Ambulatory Visit | Attending: Ophthalmology | Admitting: Ophthalmology

## 2020-10-15 ENCOUNTER — Encounter: Payer: Self-pay | Admitting: Ophthalmology

## 2020-10-15 DIAGNOSIS — Z9049 Acquired absence of other specified parts of digestive tract: Secondary | ICD-10-CM | POA: Insufficient documentation

## 2020-10-15 DIAGNOSIS — Z880 Allergy status to penicillin: Secondary | ICD-10-CM | POA: Diagnosis not present

## 2020-10-15 DIAGNOSIS — H2511 Age-related nuclear cataract, right eye: Secondary | ICD-10-CM | POA: Insufficient documentation

## 2020-10-15 DIAGNOSIS — Z8719 Personal history of other diseases of the digestive system: Secondary | ICD-10-CM | POA: Insufficient documentation

## 2020-10-15 DIAGNOSIS — Z90710 Acquired absence of both cervix and uterus: Secondary | ICD-10-CM | POA: Diagnosis not present

## 2020-10-15 DIAGNOSIS — Z888 Allergy status to other drugs, medicaments and biological substances status: Secondary | ICD-10-CM | POA: Insufficient documentation

## 2020-10-15 DIAGNOSIS — Z881 Allergy status to other antibiotic agents status: Secondary | ICD-10-CM | POA: Insufficient documentation

## 2020-10-15 DIAGNOSIS — Z79899 Other long term (current) drug therapy: Secondary | ICD-10-CM | POA: Diagnosis not present

## 2020-10-15 HISTORY — PX: CATARACT EXTRACTION W/PHACO: SHX586

## 2020-10-15 HISTORY — DX: Essential (primary) hypertension: I10

## 2020-10-15 SURGERY — PHACOEMULSIFICATION, CATARACT, WITH IOL INSERTION
Anesthesia: Monitor Anesthesia Care | Site: Eye | Laterality: Right

## 2020-10-15 MED ORDER — LIDOCAINE HCL (PF) 2 % IJ SOLN
INTRAOCULAR | Status: DC | PRN
  Administered 2020-10-15: 1 mL

## 2020-10-15 MED ORDER — TETRACAINE HCL 0.5 % OP SOLN
1.0000 [drp] | OPHTHALMIC | Status: DC | PRN
  Administered 2020-10-15 (×3): 1 [drp] via OPHTHALMIC

## 2020-10-15 MED ORDER — CYCLOPENTOLATE HCL 2 % OP SOLN
1.0000 [drp] | OPHTHALMIC | Status: DC | PRN
  Administered 2020-10-15 (×2): 1 [drp] via OPHTHALMIC

## 2020-10-15 MED ORDER — ONDANSETRON HCL 4 MG/2ML IJ SOLN: 4.0000 mg | Freq: Once | INTRAMUSCULAR | Status: DC | PRN

## 2020-10-15 MED ORDER — MOXIFLOXACIN HCL 0.5 % OP SOLN
OPHTHALMIC | Status: DC | PRN
  Administered 2020-10-15: 0.2 mL via OPHTHALMIC

## 2020-10-15 MED ORDER — SIGHTPATH DOSE#1 NA HYALUR & NA CHOND-NA HYALUR IO KIT
PACK | INTRAOCULAR | Status: DC | PRN
  Administered 2020-10-15: 1 via OPHTHALMIC

## 2020-10-15 MED ORDER — BRIMONIDINE TARTRATE-TIMOLOL 0.2-0.5 % OP SOLN
OPHTHALMIC | Status: DC | PRN
  Administered 2020-10-15: 1 [drp] via OPHTHALMIC

## 2020-10-15 MED ORDER — ACETAMINOPHEN 160 MG/5ML PO SOLN: 325.0000 mg | ORAL | Status: AC | PRN

## 2020-10-15 MED ORDER — PHENYLEPHRINE HCL 10 % OP SOLN
1.0000 [drp] | OPHTHALMIC | Status: DC | PRN
  Administered 2020-10-15 (×3): 1 [drp] via OPHTHALMIC

## 2020-10-15 MED ORDER — ONDANSETRON HCL 4 MG/2ML IJ SOLN
INTRAMUSCULAR | Status: DC | PRN
  Administered 2020-10-15: 4 mg via INTRAVENOUS

## 2020-10-15 MED ORDER — SIGHTPATH DOSE#1 BSS IO SOLN
INTRAOCULAR | Status: DC | PRN
  Administered 2020-10-15: 48 mL via OPHTHALMIC

## 2020-10-15 MED ORDER — ACETAMINOPHEN 325 MG PO TABS
650.0000 mg | ORAL_TABLET | Freq: Once | ORAL | Status: AC | PRN
  Administered 2020-10-15: 650 mg via ORAL

## 2020-10-15 MED ORDER — FENTANYL CITRATE (PF) 100 MCG/2ML IJ SOLN
INTRAMUSCULAR | Status: DC | PRN
  Administered 2020-10-15: 50 ug via INTRAVENOUS

## 2020-10-15 MED ORDER — MIDAZOLAM HCL 2 MG/2ML IJ SOLN
INTRAMUSCULAR | Status: DC | PRN
  Administered 2020-10-15: .5 mg via INTRAVENOUS

## 2020-10-15 MED ORDER — LACTATED RINGERS IV SOLN: INTRAVENOUS | Status: DC

## 2020-10-15 SURGICAL SUPPLY — 19 items
CANNULA ANT/CHMB 27G (MISCELLANEOUS) ×1 IMPLANT
CANNULA ANT/CHMB 27GA (MISCELLANEOUS) ×2 IMPLANT
GLOVE SURG ENC TEXT LTX SZ7.5 (GLOVE) ×2 IMPLANT
GLOVE SURG TRIUMPH 8.0 PF LTX (GLOVE) ×2 IMPLANT
GOWN STRL REUS W/ TWL LRG LVL3 (GOWN DISPOSABLE) ×2 IMPLANT
GOWN STRL REUS W/TWL LRG LVL3 (GOWN DISPOSABLE) ×4
LENS IOL ACRSF IQ ULTRA 15.0 (Intraocular Lens) IMPLANT
LENS IOL ACRYSOF IQ 15.0 (Intraocular Lens) ×2 IMPLANT
MARKER SKIN DUAL TIP RULER LAB (MISCELLANEOUS) ×2 IMPLANT
NDL CAPSULORHEX 25GA (NEEDLE) ×1 IMPLANT
NDL FILTER BLUNT 18X1 1/2 (NEEDLE) ×2 IMPLANT
NEEDLE CAPSULORHEX 25GA (NEEDLE) ×2 IMPLANT
NEEDLE FILTER BLUNT 18X 1/2SAF (NEEDLE) ×2
NEEDLE FILTER BLUNT 18X1 1/2 (NEEDLE) ×2 IMPLANT
PACK EYE AFTER SURG (MISCELLANEOUS) ×2 IMPLANT
SYR 3ML LL SCALE MARK (SYRINGE) ×4 IMPLANT
SYR TB 1ML LUER SLIP (SYRINGE) ×2 IMPLANT
WATER STERILE IRR 250ML POUR (IV SOLUTION) ×2 IMPLANT
WIPE NON LINTING 3.25X3.25 (MISCELLANEOUS) ×2 IMPLANT

## 2020-10-15 NOTE — Anesthesia Postprocedure Evaluation (Signed)
Anesthesia Post Note  Patient: Jamie Foley  Procedure(s) Performed: CATARACT EXTRACTION PHACO AND INTRAOCULAR LENS PLACEMENT (IOC) RIGHT 4.72 00:49.3 (Right: Eye)     Patient location during evaluation: PACU Anesthesia Type: MAC Level of consciousness: awake and alert, oriented and patient cooperative Pain management: pain level controlled Vital Signs Assessment: post-procedure vital signs reviewed and stable Respiratory status: spontaneous breathing, nonlabored ventilation and respiratory function stable Cardiovascular status: blood pressure returned to baseline and stable Postop Assessment: adequate PO intake Anesthetic complications: no   No notable events documented.  Darrin Nipper

## 2020-10-15 NOTE — Transfer of Care (Signed)
Immediate Anesthesia Transfer of Care Note  Patient: Jamie Foley  Procedure(s) Performed: CATARACT EXTRACTION PHACO AND INTRAOCULAR LENS PLACEMENT (IOC) RIGHT 4.72 00:49.3 (Right: Eye)  Patient Location: PACU  Anesthesia Type: MAC  Level of Consciousness: awake, alert  and patient cooperative  Airway and Oxygen Therapy: Patient Spontanous Breathing and Patient connected to supplemental oxygen  Post-op Assessment: Post-op Vital signs reviewed, Patient's Cardiovascular Status Stable, Respiratory Function Stable, Patent Airway and No signs of Nausea or vomiting  Post-op Vital Signs: Reviewed and stable  Complications: No notable events documented.

## 2020-10-15 NOTE — Anesthesia Preprocedure Evaluation (Addendum)
Anesthesia Evaluation  Patient identified by MRN, date of birth, ID band Patient awake    Reviewed: Allergy & Precautions, NPO status , Patient's Chart, lab work & pertinent test results  History of Anesthesia Complications (+) PONV, PROLONGED EMERGENCE and history of anesthetic complications  Airway Mallampati: I   Neck ROM: Full    Dental no notable dental hx.    Pulmonary neg pulmonary ROS,    Pulmonary exam normal breath sounds clear to auscultation       Cardiovascular hypertension, Normal cardiovascular exam Rhythm:Regular Rate:Normal     Neuro/Psych negative neurological ROS     GI/Hepatic negative GI ROS,   Endo/Other    Renal/GU Renal disease (polycystic kidney disease)     Musculoskeletal  (+) Arthritis ,   Abdominal   Peds  Hematology  (+) Blood dyscrasia, anemia ,   Anesthesia Other Findings   Reproductive/Obstetrics                            Anesthesia Physical Anesthesia Plan  ASA: 2  Anesthesia Plan: MAC   Post-op Pain Management:    Induction: Intravenous  PONV Risk Score and Plan: 3 and TIVA, Midazolam and Treatment may vary due to age or medical condition  Airway Management Planned: Nasal Cannula  Additional Equipment:   Intra-op Plan:   Post-operative Plan:   Informed Consent: I have reviewed the patients History and Physical, chart, labs and discussed the procedure including the risks, benefits and alternatives for the proposed anesthesia with the patient or authorized representative who has indicated his/her understanding and acceptance.       Plan Discussed with: CRNA  Anesthesia Plan Comments:        Anesthesia Quick Evaluation

## 2020-10-15 NOTE — Op Note (Signed)
LOCATION:  [facility]   PREOPERATIVE DIAGNOSIS:    Nuclear sclerotic cataract right eye. H25.11   POSTOPERATIVE DIAGNOSIS:  Nuclear sclerotic cataract right eye.     PROCEDURE:  Phacoemusification with posterior chamber intraocular lens placement of the right eye   ULTRASOUND TIME: Procedure(s): CATARACT EXTRACTION PHACO AND INTRAOCULAR LENS PLACEMENT (IOC) RIGHT 4.72 00:49.3 (Right)  LENS:   Implant Name Type Inv. Item Serial No. Manufacturer Lot No. LRB No. Used Action  LENS IOL ACRYSOF IQ 15.0 - [id number] Intraocular Lens LENS IOL ACRYSOF IQ 15.0 [id number] ALCON  Right 1 Implanted         SURGEON:  Deirdre Evener, MD   ANESTHESIA:  Topical with tetracaine drops and 2% Xylocaine jelly, augmented with 1% preservative-free intracameral lidocaine.    COMPLICATIONS:  None.   DESCRIPTION OF PROCEDURE:  The patient was identified in the holding room and transported to the operating room and placed in the supine position under the operating microscope.  The right eye was identified as the operative eye and it was prepped and draped in the usual sterile ophthalmic fashion.   A 1 millimeter clear-corneal paracentesis was made at the 12:00 position.  0.5 ml of preservative-free 1% lidocaine was injected into the anterior chamber. The anterior chamber was filled with Viscoat viscoelastic.  A 2.4 millimeter keratome was used to make a near-clear corneal incision at the 9:00 position.  A curvilinear capsulorrhexis was made with a cystotome and capsulorrhexis forceps.  Balanced salt solution was used to hydrodissect and hydrodelineate the nucleus.   Phacoemulsification was then used in stop and chop fashion to remove the lens nucleus and epinucleus.  The remaining cortex was then removed using the irrigation and aspiration handpiece. Provisc was then placed into the capsular bag to distend it for lens placement.  A lens was then injected into the capsular bag.  The remaining  viscoelastic was aspirated.   Wounds were hydrated with balanced salt solution.  The anterior chamber was inflated to a physiologic pressure with balanced salt solution.  No wound leaks were noted. Vigamox 0.2 ml of a 1mg  per ml solution was injected into the anterior chamber for a dose of 0.2 mg of intracameral antibiotic at the completion of the case.   Timolol and Brimonidine drops were applied to the eye.  The patient was taken to the recovery room in stable condition without complications of anesthesia or surgery.   Jamie Foley 10/15/2020, 2:21 PM

## 2020-10-15 NOTE — H&P (Signed)
[facility]   Primary Care Physician:  Jerl Mina, MD Ophthalmologist: Dr. Lockie Mola  Pre-Procedure History & Physical: HPI:  Jamie Foley is a 65 y.o. female here for ophthalmic surgery.   Past Medical History:  Diagnosis Date   Anemia    Arthritis    Complication of anesthesia    HARD TO WAKE UP AFTER ANKLE SURGERY-FELT LIKE SHE RECEIVED TO MUCH ANESTHESIA   Hypertension    Osteoporosis    Polycystic kidney disease    Renal insufficiency     Past Surgical History:  Procedure Laterality Date   ABDOMINAL HYSTERECTOMY     CATARACT EXTRACTION W/PHACO Left 10/14/2014   Procedure: CATARACT EXTRACTION PHACO AND INTRAOCULAR LENS PLACEMENT (IOC);  Surgeon: Sallee Lange, MD;  Location: [facility] ORS;  Service: Ophthalmology;  Laterality: Left;  [place] 01:09 AP% 60.0 CDE 37.21 fluid pack lot #[id number]   CHOLECYSTECTOMY     ESOPHAGOGASTRODUODENOSCOPY (EGD) WITH PROPOFOL N/A 12/01/2018   Procedure: ESOPHAGOGASTRODUODENOSCOPY (EGD) WITH PROPOFOL;  Surgeon: Toney Reil, MD;  Location: [facility] ENDOSCOPY;  Service: Gastroenterology;  Laterality: N/A;   EYE SURGERY Left    DETACHED RETINA    HERNIA REPAIR     INGUINAL HERNIA REPAIR Left 08/03/2016   Procedure: HERNIA REPAIR INGUINAL ADULT;  Surgeon: Nadeen Landau, MD;  Location: [facility] ORS;  Service: General;  Laterality: Left;   right ankle fracture Right    VENTRAL HERNIA REPAIR N/A 08/03/2016   Procedure: HERNIA REPAIR VENTRAL ADULT;  Surgeon: Nadeen Landau, MD;  Location: [facility] ORS;  Service: General;  Laterality: N/A;    Prior to Admission medications   Medication Sig Start Date End Date Taking? Authorizing Provider  calcium carbonate (TUMS - DOSED IN MG ELEMENTAL CALCIUM) 500 MG chewable tablet Chew 1 tablet by mouth 2 (two) times daily as needed for indigestion or heartburn.   Yes [provider]  cholecalciferol (VITAMIN D) 25 MCG (1000 UNIT) tablet Take 1,000 Units by mouth daily.   Yes  [provider]  cyanocobalamin 1000 MCG tablet Take by mouth.   Yes [provider]  losartan (COZAAR) 25 MG tablet Take 25 mg by mouth daily.   Yes [provider]  Multiple Vitamin (MULTIVITAMIN) tablet Take 1 tablet by mouth daily.   Yes [provider]    Allergies as of 09/22/2020 - Review Complete 06/12/2020  Allergen Reaction Noted   Augmentin [amoxicillin-pot clavulanate] Nausea Only and Other (See Comments) 10/11/2014   Levofloxacin Other (See Comments) 05/24/2016   Prednisone Nausea And Vomiting and Other (See Comments) 07/21/2016    Family History  Problem Relation Age of Onset   Heart attack Father 15    Social History   Socioeconomic History   Marital status: Legally Separated    Spouse name: Not on file   Number of children: Not on file   Years of education: Not on file   Highest education level: Not on file  Occupational History   Not on file  Tobacco Use   Smoking status: Never   Smokeless tobacco: Never  Vaping Use   Vaping Use: Never used  Substance and Sexual Activity   Alcohol use: No   Drug use: No   Sexual activity: Not on file  Other Topics Concern   Not on file  Social History Narrative   Not on file   Social Determinants of Health   Financial Resource Strain: Not on file  Food Insecurity: Not on file  Transportation Needs: Not on file  Physical Activity: Not on file  Stress: Not on file  Social Connections: Not on file  Intimate Partner Violence: Not on file    Review of Systems: See HPI, otherwise negative ROS  Physical Exam: BP (!) 154/86   Pulse 67   Temp (!) 97.3 F (36.3 C) (Temporal)   Resp (!) 2   Ht 5\' 5"  (1.651 m)   Wt 48.5 kg   SpO2 98%   BMI 17.81 kg/m  General:   Alert,  pleasant and cooperative in NAD Head:  Normocephalic and atraumatic. Lungs:  Clear to auscultation.    Heart:  Regular rate and rhythm.   Impression/Plan: TING CAGE is here for ophthalmic  surgery.  Risks, benefits, limitations, and alternatives regarding ophthalmic surgery have been reviewed with the patient.  Questions have been answered.  All parties agreeable.   [name], MD  10/15/2020, 1:05 PM

## 2020-10-17 ENCOUNTER — Encounter: Payer: Self-pay | Admitting: Ophthalmology

## 2020-10-20 ENCOUNTER — Inpatient Hospital Stay: Payer: Medicare Other | Attending: Oncology

## 2020-10-20 ENCOUNTER — Inpatient Hospital Stay: Payer: Medicare Other | Admitting: Oncology

## 2021-10-03 ENCOUNTER — Emergency Department
Admission: EM | Admit: 2021-10-03 | Discharge: 2021-10-03 | Disposition: A | Discharge status: Home / Self Care | Payer: Medicare Other | Attending: Emergency Medicine | Admitting: Emergency Medicine

## 2021-10-03 ENCOUNTER — Encounter: Payer: Self-pay | Admitting: Emergency Medicine

## 2021-10-03 ENCOUNTER — Other Ambulatory Visit: Payer: Self-pay

## 2021-10-03 ENCOUNTER — Emergency Department: Payer: Medicare Other

## 2021-10-03 DIAGNOSIS — L03116 Cellulitis of left lower limb: Secondary | ICD-10-CM | POA: Insufficient documentation

## 2021-10-03 DIAGNOSIS — M79605 Pain in left leg: Secondary | ICD-10-CM | POA: Diagnosis present

## 2021-10-03 DIAGNOSIS — I10 Essential (primary) hypertension: Secondary | ICD-10-CM | POA: Insufficient documentation

## 2021-10-03 DIAGNOSIS — R739 Hyperglycemia, unspecified: Secondary | ICD-10-CM | POA: Diagnosis not present

## 2021-10-03 LAB — CBG MONITORING, ED: Glucose-Capillary: 229 mg/dL — ABNORMAL HIGH (ref 70–99)

## 2021-10-03 MED ORDER — CEPHALEXIN 500 MG PO CAPS: 500.0000 mg | ORAL_CAPSULE | Freq: Four times a day (QID) | ORAL | 0 refills | Status: AC

## 2021-10-03 MED ORDER — ONDANSETRON 4 MG PO TBDP: 4.0000 mg | ORAL_TABLET | Freq: Three times a day (TID) | ORAL | 0 refills | Status: AC | PRN

## 2021-10-03 NOTE — ED Triage Notes (Signed)
Pt via POV from home. Pt c/o wound on the L shin, states 2 weeks ago she hit it on a mop bucket and now the area is swollen and red. Denies hx of diabetes. Denies blood thinners. Pt is A&OX4 and NAD. Ambulatory to triage.

## 2021-10-03 NOTE — ED Provider Notes (Signed)
[facility] Provider Note    Event Date/Time   First MD Initiated Contact with Patient 10/03/21 1108     (approximate)   History   Chief Complaint Leg Pain   HPI Jamie Foley is a 66 y.o. female, history of hyperlipidemia, PKD, osteoporosis, renal insufficiency, hypertension, presents to the emergency department for evaluation of leg pain.  Reports hitting her left shin on a mop bucket approximately 2 weeks ago.  She states that it has failed to heal and is now swollen and red and has been leaking purulent material.  She has been treating with a " paste" provided by a family member to help " draw out the infection", however this has not helped.  Denies fever/chills, chest pain, shortness of breath, abdominal pain, nausea/vomiting, diarrhea, dizziness/lightheadedness, night sweats, cold sensation in lower extremities, or numbness/tingling in lower extremities.   History Limitations: No limitations.        Physical Exam  Triage Vital Signs: ED Triage Vitals [10/03/21 1042]  Enc Vitals Group     BP (!) 149/90     Pulse Rate 97     Resp 20     Temp 97.6 F (36.4 C)     Temp Source Oral     SpO2 98 %     Weight 120 lb (54.4 kg)     Height 5\' 5"  (1.651 m)     Head Circumference      Peak Flow      Pain Score 4     Pain Loc      Pain Edu?      Excl. in GC?     Most recent vital signs: Vitals:   10/03/21 1042  BP: (!) 149/90  Pulse: 97  Resp: 20  Temp: 97.6 F (36.4 C)  SpO2: 98%    General: Awake, NAD.  Skin: Warm, dry. No rashes or lesions.  Eyes: PERRL. Conjunctivae normal.  CV: Good peripheral perfusion.  Resp: Normal effort.  Abd: Soft, non-tender. No distention.  Neuro: At baseline. No gross neurological deficits.   Focused Exam: Dime-sized open wound along the midshaft tibial region, anterior aspect.  No active bleeding, though does appear to have mild purulent drainage.  Mild tenderness.  No surrounding warmth.  Normal range  of motion of the left lower extremity.  PMS intact.  Physical Exam    ED Results / Procedures / Treatments  Labs (all labs ordered are listed, but only abnormal results are displayed) Labs Reviewed  CBG MONITORING, ED - Abnormal; Notable for the following components:      Result Value   Glucose-Capillary 229 (*)    All other components within normal limits     EKG Not applicable   RADIOLOGY  ED Provider Interpretation: I personally interpreted this x-ray, no evidence of osteomyelitis or fracture  DG Tibia/Fibula Left  Result Date: 10/03/2021 CLINICAL DATA:  Left leg ulcer.  Evaluate for osteomyelitis. EXAM: LEFT TIBIA AND FIBULA - 2 VIEW COMPARISON:  None Available. FINDINGS: There is no evidence of fracture or other focal bone lesions. No evidence of osteolysis or periostitis. Soft tissues are unremarkable. IMPRESSION: Negative.  No radiographic evidence of osteomyelitis. Electronically Signed   By: 12/04/2021 M.D.   On: 10/03/2021 11:33    PROCEDURES:  Critical Care performed: None.  Procedures    MEDICATIONS ORDERED IN ED: Medications - No data to display   IMPRESSION / MDM / ASSESSMENT AND PLAN / ED COURSE  I reviewed  the triage vital signs and the nursing notes.                              Differential diagnosis includes, but is not limited to, cellulitis/abscess, tibia fracture, vascular insufficiency, diabetes, diabetic ulcer.  ED Course Patient appears well, vitals are within normal limits.  NAD.  Assessment/Plan Patient presents with dime size open wound on the anterior aspect of left tibial region, midshaft.  Given the purulent drainage and surrounding erythema, concern for cellulitis.  X-ray shows no evidence of fracture or osteomyelitis.  We will provide her with antibiotics.  Her blood glucose here today was 229, suspicious for undiagnosed diabetes.  Advised her to follow-up with her primary care provider for further testing.  We will plan to  discharge.  Patient's presentation is most consistent with acute complicated illness / injury requiring diagnostic workup.   Provided the patient with anticipatory guidance, return precautions, and educational material. Encouraged the patient to return to the emergency department at any time if they begin to experience any new or worsening symptoms. Patient expressed understanding and agreed with the plan.       FINAL CLINICAL IMPRESSION(S) / ED DIAGNOSES   Final diagnoses:  Cellulitis of left lower extremity  Hyperglycemia     Rx / DC Orders   ED Discharge Orders          Ordered    cephALEXin (KEFLEX) 500 MG capsule  4 times daily        10/03/21 1218    ondansetron (ZOFRAN-ODT) 4 MG disintegrating tablet  Every 8 hours PRN        10/03/21 1218             Note:  This document was prepared using Dragon voice recognition software and may include unintentional dictation errors.   Varney Daily, [place] 10/03/21 1220    Shaune Pollack, MD 10/04/21 1141

## 2021-10-03 NOTE — Discharge Instructions (Addendum)
-  Take all of your antibiotics as prescribed.  You may take the ondansetron as needed for nausea in case this is a side effect from the antibiotics.  -You may wash the wound daily with soap and water.  Apply topical antibiotic ointment daily and keep covered with Band-Aid  -Follow-up with your primary care provider within the next week for follow-up of the wound, as well as further testing for suspected diabetes.  -Return to the emergency department anytime if you begin to experience any new or worsening symptoms.

## 2022-04-09 ENCOUNTER — Other Ambulatory Visit: Payer: Self-pay | Admitting: Family Medicine

## 2022-04-09 DIAGNOSIS — Z1231 Encounter for screening mammogram for malignant neoplasm of breast: Secondary | ICD-10-CM

## 2022-04-30 ENCOUNTER — Ambulatory Visit
Admission: RE | Admit: 2022-04-30 | Discharge: 2022-04-30 | Disposition: A | Discharge status: Home / Self Care | Payer: Medicare Other | Source: Ambulatory Visit | Attending: Family Medicine | Admitting: Family Medicine

## 2022-04-30 DIAGNOSIS — Z1231 Encounter for screening mammogram for malignant neoplasm of breast: Secondary | ICD-10-CM | POA: Insufficient documentation
